# Patient Record
Sex: Female | Born: 1990 | Race: Black or African American | Hispanic: No | Marital: Single | Smoking: Former smoker
Health system: Southern US, Community
[De-identification: ages and names within clinical notes are randomized; demographics above are authoritative.]

## PROBLEM LIST (undated history)

## (undated) ENCOUNTER — Ambulatory Visit: Discharge status: Absent without leave | Payer: Self-pay

## (undated) ENCOUNTER — Inpatient Hospital Stay (HOSPITAL_COMMUNITY): Payer: Self-pay

## (undated) DIAGNOSIS — E669 Obesity, unspecified: Secondary | ICD-10-CM

## (undated) DIAGNOSIS — D649 Anemia, unspecified: Secondary | ICD-10-CM

## (undated) HISTORY — PX: WISDOM TOOTH EXTRACTION: SHX21

## (undated) HISTORY — DX: Obesity, unspecified: E66.9

## (undated) HISTORY — DX: Anemia, unspecified: D64.9

## (undated) HISTORY — PX: CHOLECYSTECTOMY: SHX55

## (undated) HISTORY — PX: ELBOW FRACTURE SURGERY: SHX616

---
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
## 2011-02-09 IMAGING — US US ABDOMEN COMPLETE
1 series · 13 of 25 positions shown · non-contrast
Comparison: None

CLINICAL DATA: Abdominal pain.  Question cholelithiasis.

COMPLETE ABDOMINAL ULTRASOUND

[Series 1: us abdomen complete · 0.32mm/px · 13 of 45 slices shown]
[im 1/45]
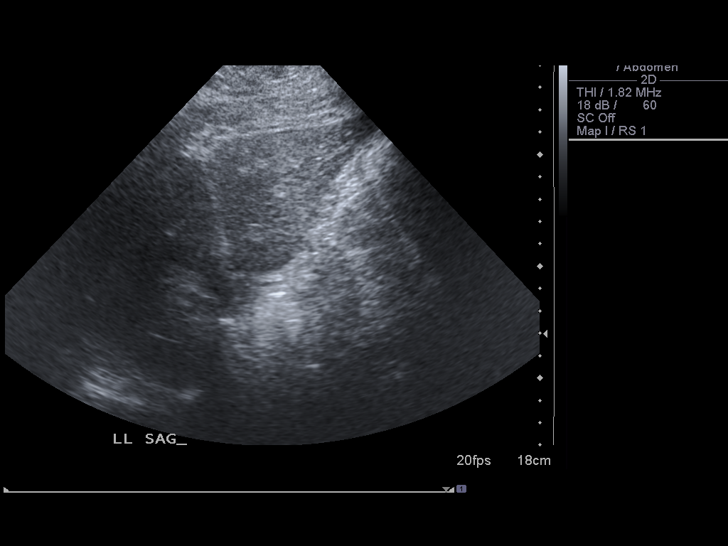
[im 4/45]
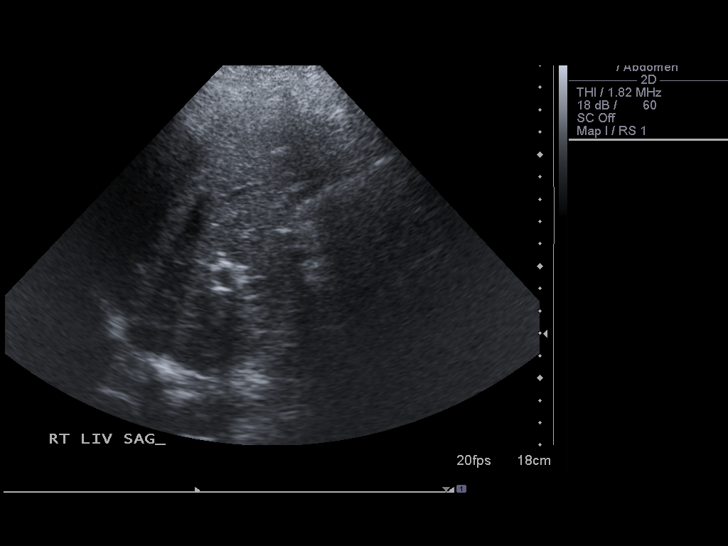
[im 8/45]
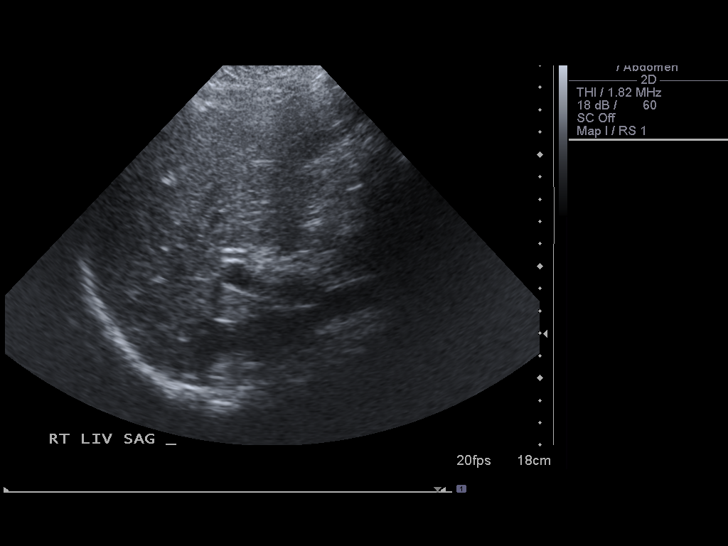
[im 12/45]
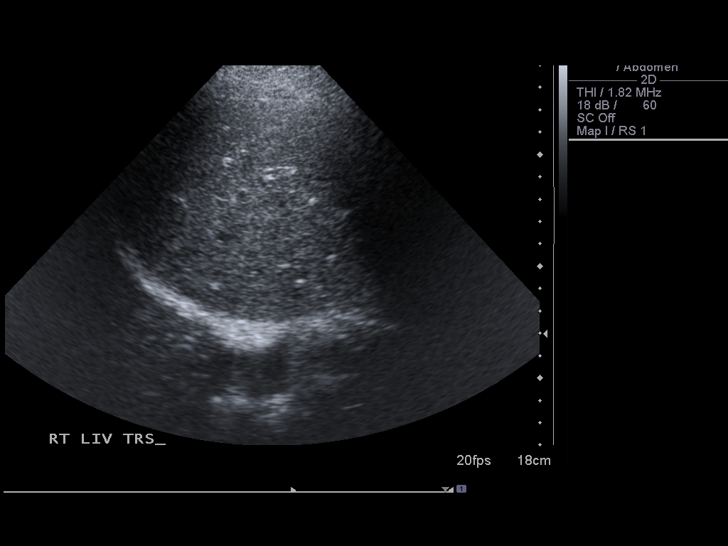
[im 15/45]
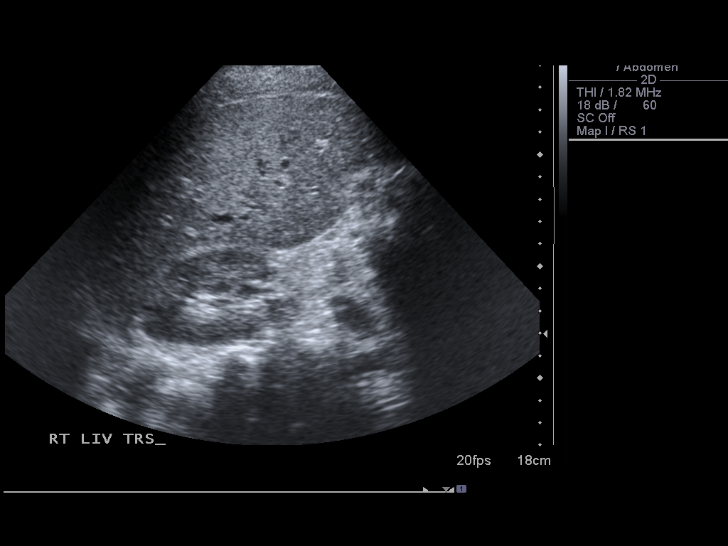
[im 19/45]
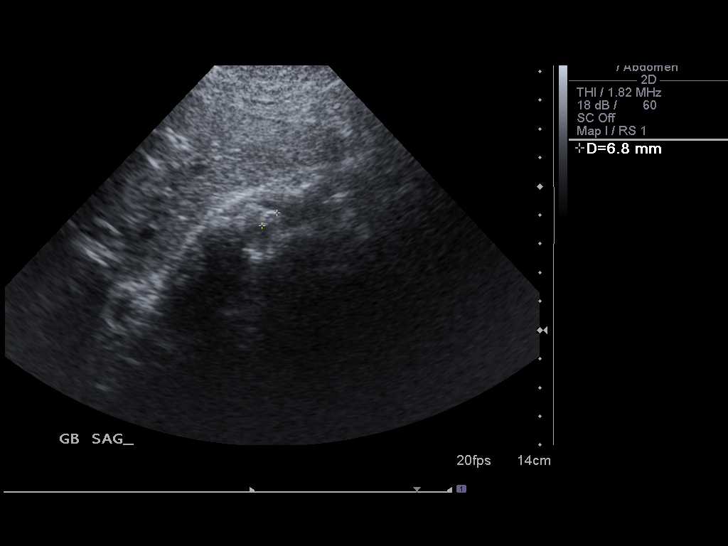
[im 23/45]
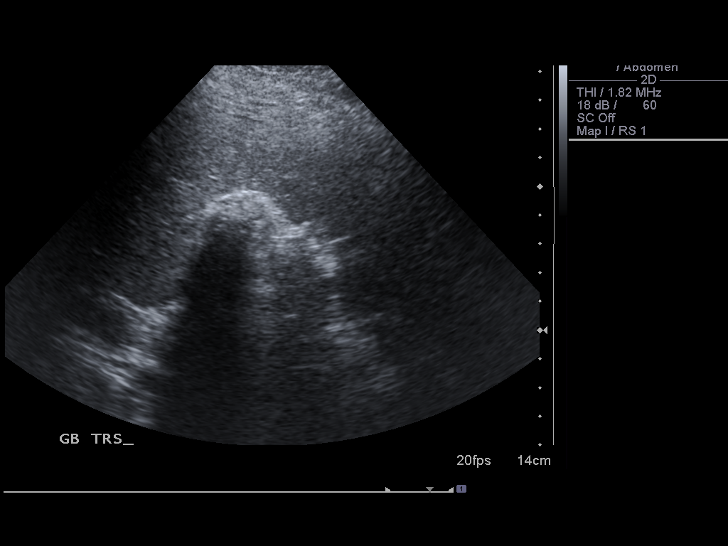
[im 26/45]
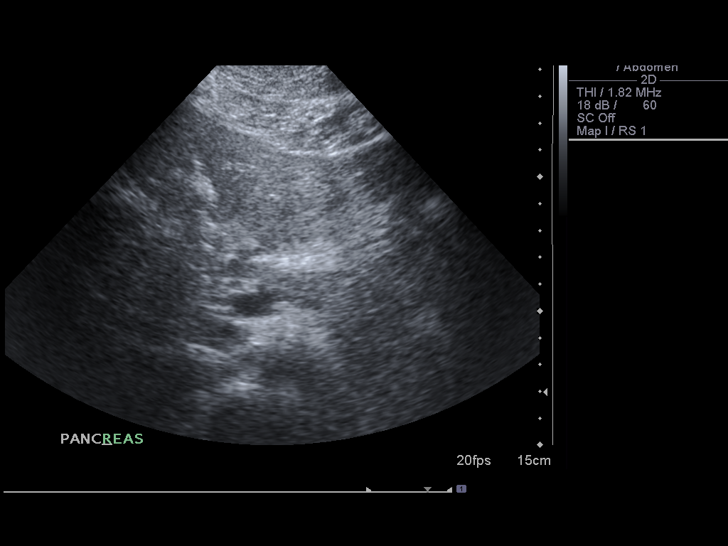
[im 30/45]
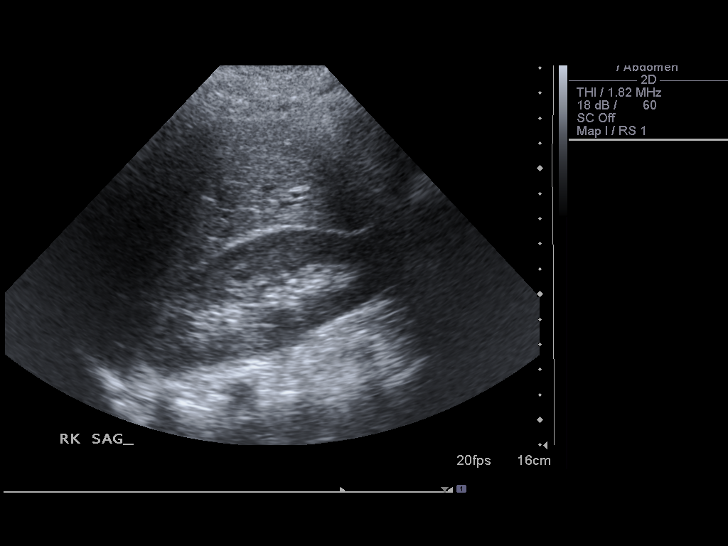
[im 34/45]
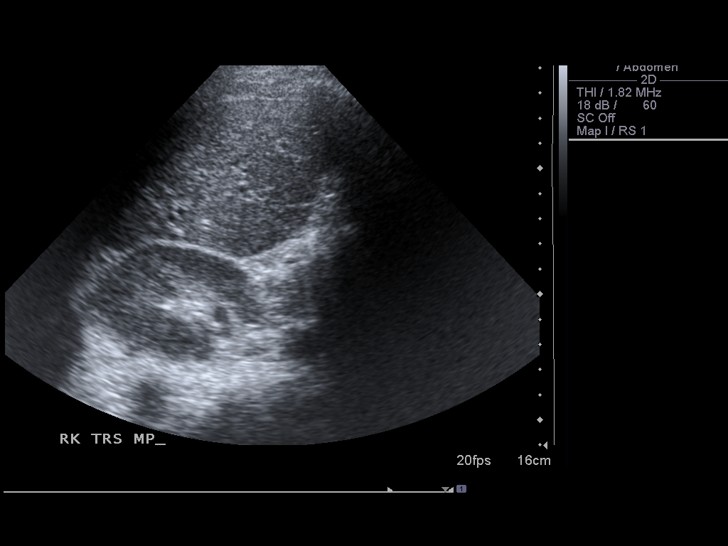
[im 37/45]
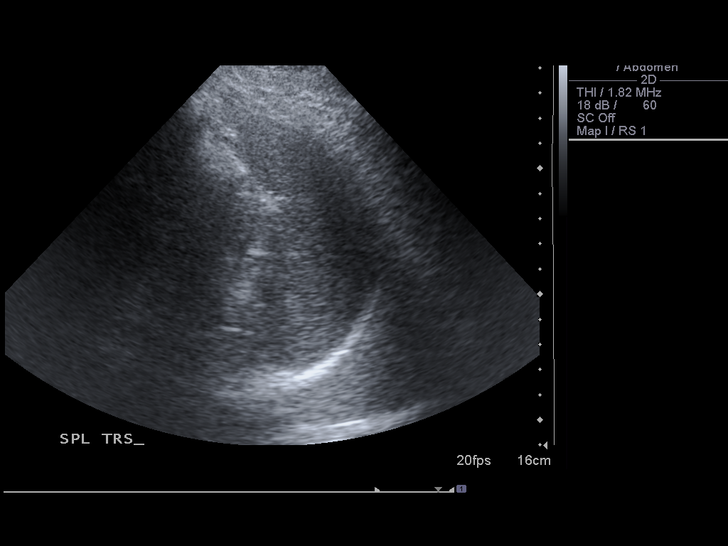
[im 41/45]
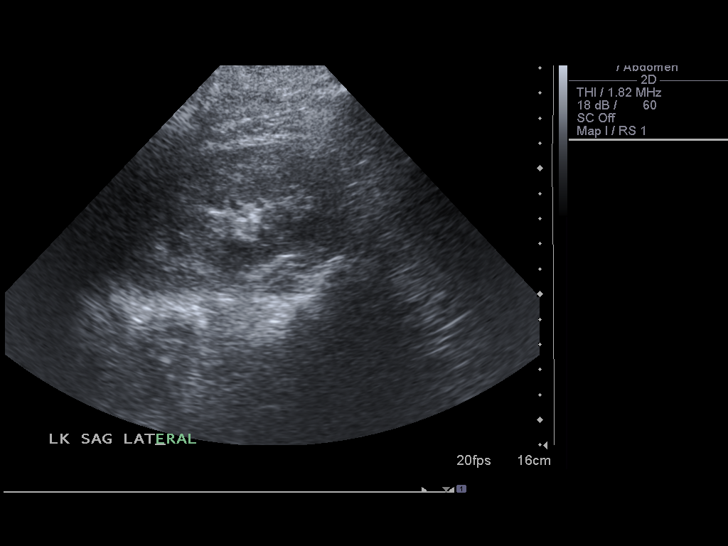
[im 45/45]
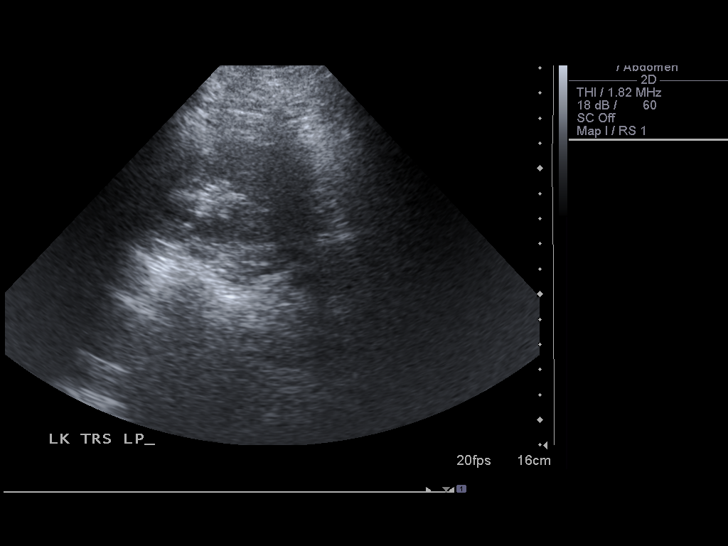

[13 of 25 positions shown; findings below may reference images not displayed]

FINDINGS: Gallbladder: The gallbladder is poorly contracted around multiple
gallstones (wall echo shadow sign).  There is no significant
gallbladder wall thickening.  Sonographic Murphy's sign is
negative.

Common bile duct:   Normal in caliber without filling defects.

Liver:  Echogenicity is within normal limits.  No focal hepatic
abnormalities are identified.

IVC:  Visualized portions appear unremarkable.

Pancreas:  Visualized portions appear unremarkable.Portions of the
pancreas are obscured by bowel gas.

Spleen:  Visualized portions appear unremarkable.

Right Kidney:   The renal cortical thickness and echogenicity are
preserved.  There is no hydronephrosis or focal abnormality. Renal
length is 10.7 cm.

Left Kidney:   The renal cortical thickness and echogenicity are
preserved.  There is no hydronephrosis or focal abnormality. Renal
length is 11.3 cm.

Abdominal aorta:  Visualized portions appear unremarkable.
IMPRESSION: 1.  Multiple gallstones within a contracted gallbladder.  No
sonographic evidence of cholecystitis or biliary dilatation.
2.  Otherwise unremarkable study.  Pancreatic visualization is
suboptimal.

## 2011-12-08 IMAGING — CR DG ELBOW 2V*R*
2 series · 2 of 2 positions shown · non-contrast
Comparison: None.

CLINICAL DATA: Fall.  Elbow injury and pain.

RIGHT ELBOW - 2 VIEW

[view not recorded (1 of 2)]
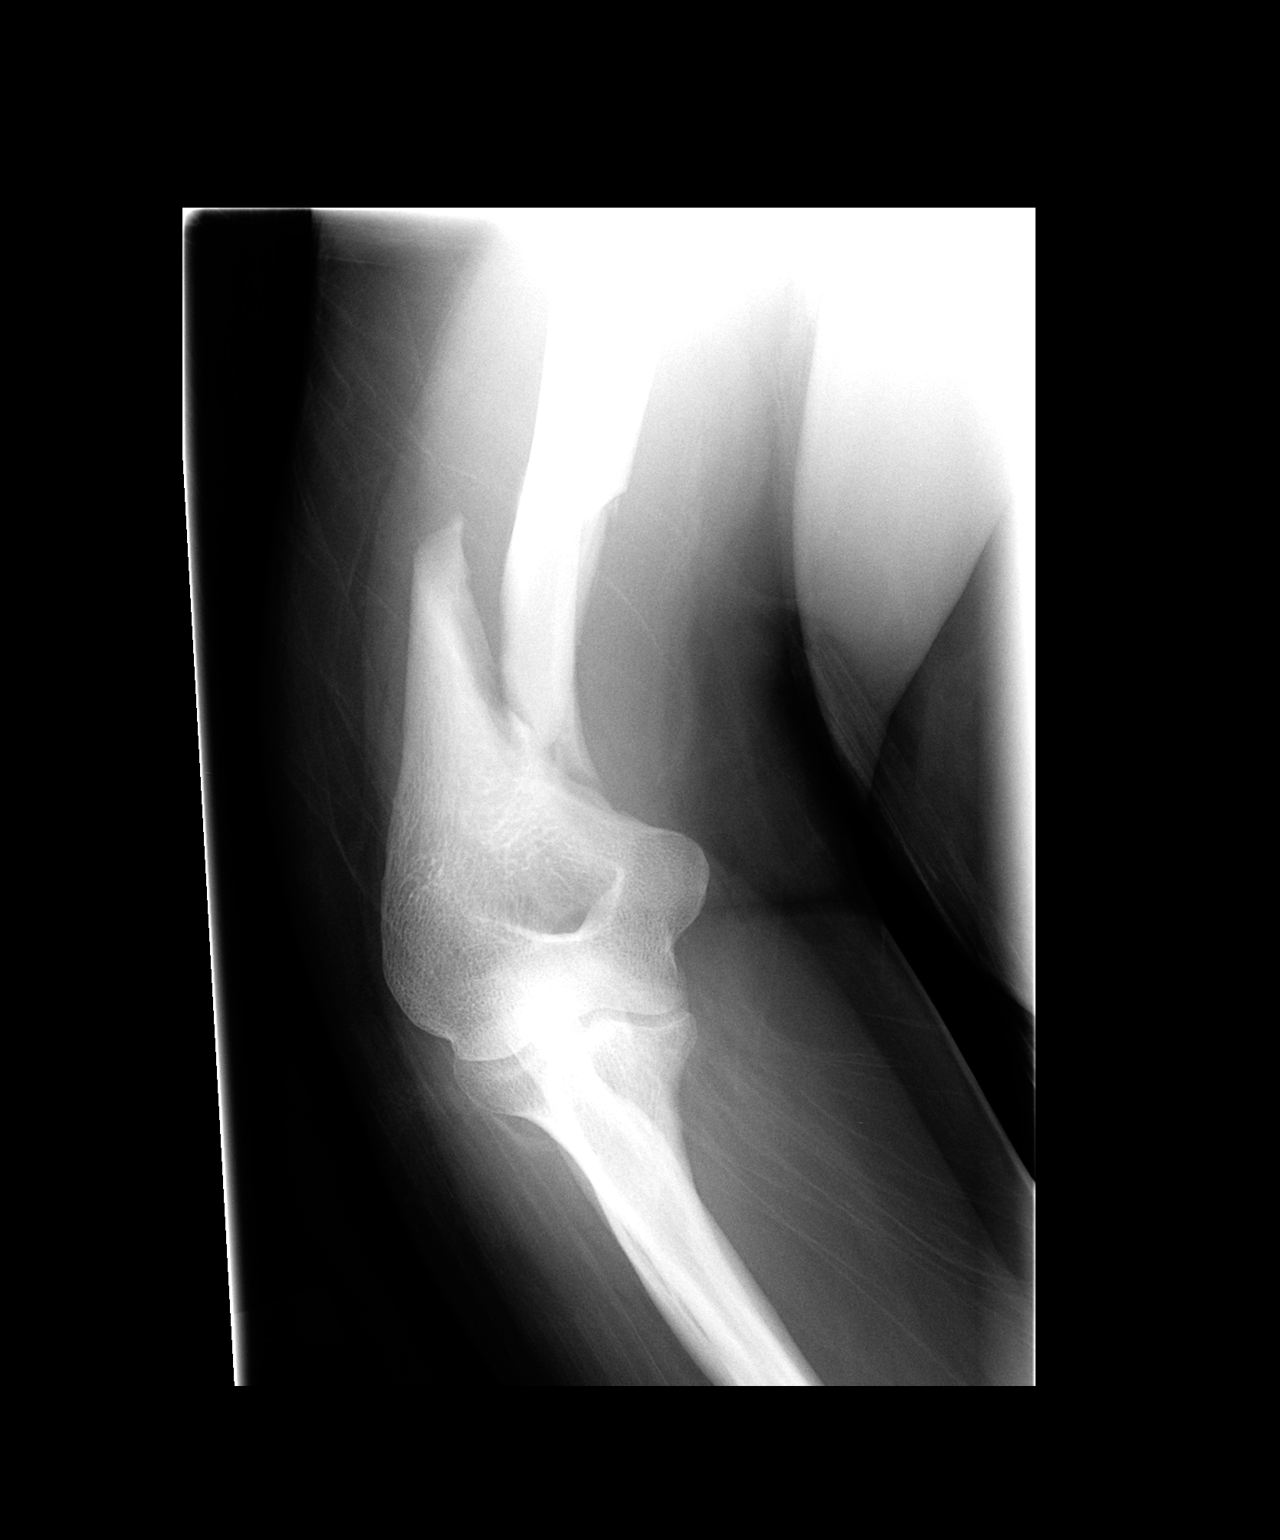

[view not recorded (2 of 2)]
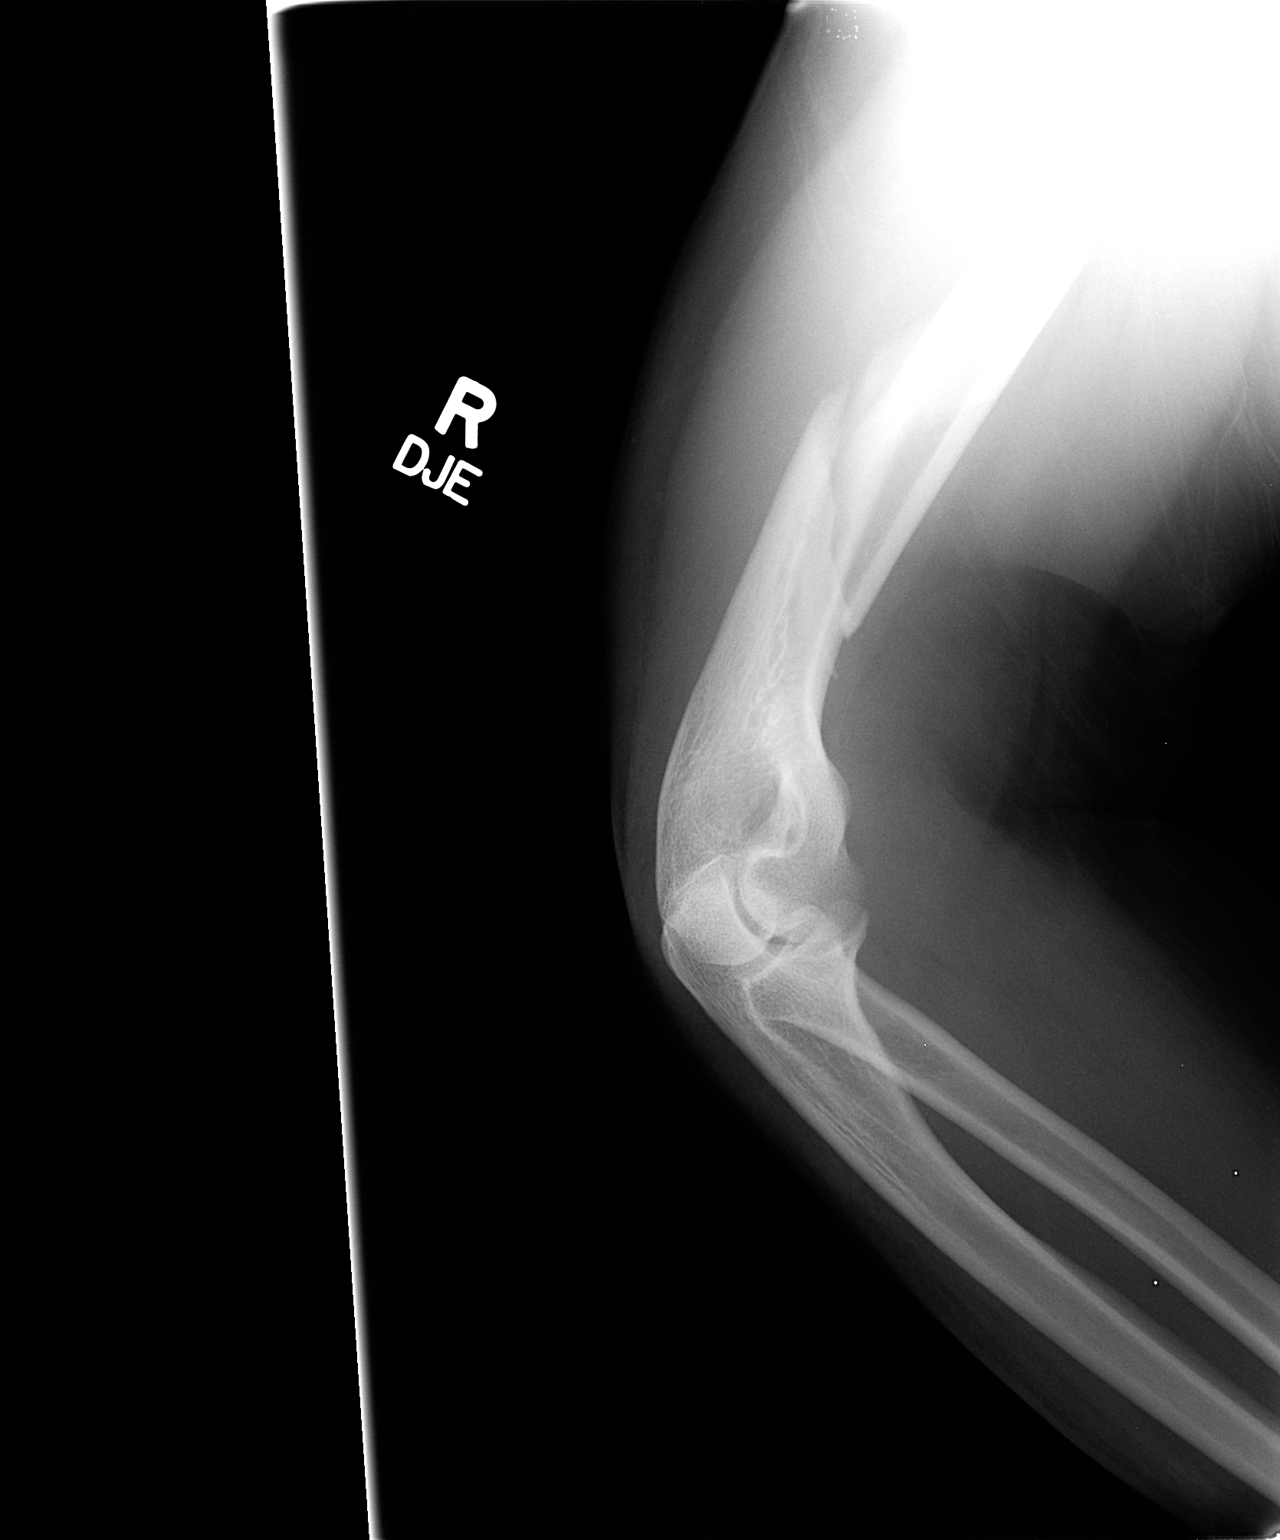

[2 of 2 positions shown; findings below may reference images not displayed]

FINDINGS: An oblique fracture of the distal humeral diaphysis is
seen with posterior displacement of the distal fracture fragment.
No fracture is seen involving the elbow joint.
IMPRESSION: Distal humeral diaphyseal fracture.

## 2012-03-27 ENCOUNTER — Encounter: Payer: Self-pay | Admitting: Obstetrics and Gynecology

## 2012-03-27 ENCOUNTER — Ambulatory Visit (INDEPENDENT_AMBULATORY_CARE_PROVIDER_SITE_OTHER): Payer: BC Managed Care – PPO | Admitting: Obstetrics and Gynecology

## 2012-03-27 VITALS — BP 122/78 | HR 70 | Ht 64.0 in | Wt 226.0 lb

## 2012-03-27 DIAGNOSIS — Z01419 Encounter for gynecological examination (general) (routine) without abnormal findings: Secondary | ICD-10-CM

## 2012-03-27 DIAGNOSIS — Z113 Encounter for screening for infections with a predominantly sexual mode of transmission: Secondary | ICD-10-CM

## 2012-03-27 NOTE — Progress Notes (Signed)
Regular Periods: yes Mammogram: no  Monthly Breast Ex.: no Exercise: yes  Tetanus < 10 years: yes Seatbelts: yes  NI. Bladder Functn.: yes Abuse at home: no  Daily BM's: yes Stressful Work: no  Healthy Diet: yes Sigmoid-Colonoscopy: NO  Calcium: no Medical problems this year: WANT BIRTH CONTROL   LAST PAP:2 YEARS AGO  Contraception: WITHDRAWAL   Mammogram:  NO  PCP: DR. Renae Gloss  PMH: NO CHANGE  [facility]: NO CHANGE  Last Bone Scan: NO  PT IS SINGLE (IN RELATIONSHIP)

## 2012-03-27 NOTE — Progress Notes (Signed)
Subjective:    [name] is a 21 y.o. female, G0P0, who presents for an annual exam. The patient wants to review contraception options.  Given handout of methods of contraception. Answered her questions about Nexplanon, Depo Provera and Ortho Evra Patch.  Menstrual cycle:   LMP: Patient's last menstrual period was 03/02/2012. Flow x 7 days with pad change 3 times a day;  4/10 cramps relieved with Tylenol or Ibuprofen             Review of Systems Pertinent items are noted in HPI. Denies pelvic pain, urinary tract symptoms, vaginitis symptoms, irregular bleeding, menopausal symptoms, change in bowel habits or rectal bleeding   Objective:    BP 122/78  Pulse 70  Ht 5\' 4"  (1.626 m)  Wt 226 lb (102.513 kg)  BMI 38.79 kg/m2  LMP 03/02/2012   @Weigh @t :  Wt Readings from Last 1 Encounters:  03/27/12 226 lb (102.513 kg)   Body mass index is 38.79 kg/(m^2). General Appearance: Alert, no acute distress HEENT: Grossly normal Neck / Thyroid: Supple, no thyromegaly or cervical adenopathy Lungs: Clear to auscultation bilaterally Back: No CVA tenderness Breast Exam: No masses or nodes.No dimpling, nipple retraction or discharge. Cardiovascular: Regular rate and rhythm.  Gastrointestinal: Soft, non-tender, no masses or organomegaly Pelvic Exam: EGBUS-wnl, vagina-normal rugae, cervix- without lesions or tenderness, uterus appears normal size shape and consistency, adnexae-no masses or tenderness Rectovaginal: no masses and normal sphincter tone Lymphatic Exam: Non-palpable nodes in neck, clavicular,  axillary, or inguinal regions  Skin: no rashes or abnormalities Extremities: no clubbing cyanosis or edema  Neurologic: grossly normal Psychiatric: Alert and oriented  Wet Prep: Urinalysis: UPT:    Assessment:   Routine GYN Exam   Plan:  Given handout of methods of contraception   GC/CT pending.  Nexplanon brochure and insertion appointment  RTO 1 year or  prn  Yuridia Couts,ELMIRAPA-C

## 2012-03-28 LAB — GC/CHLAMYDIA PROBE AMP, GENITAL
Chlamydia, DNA Probe: POSITIVE — AB
GC Probe Amp, Genital: NEGATIVE

## 2012-03-29 ENCOUNTER — Telehealth: Payer: Self-pay

## 2012-03-29 MED ORDER — AZITHROMYCIN 500 MG PO TABS: 1000.0000 mg | ORAL_TABLET | Freq: Once | ORAL | Status: AC

## 2012-03-29 NOTE — Telephone Encounter (Signed)
Message copied by Winfred Leeds on Wed Mar 29, 2012 11:08 AM ------      Message from: Henreitta Leber      Created: Tue Mar 28, 2012  9:35 PM       Patient with positive chlamydia needs to be treated with Zithromax 500 mg  #2  2 po stat and review STD protocol.  Thanks,  EP

## 2012-03-29 NOTE — Telephone Encounter (Signed)
LM ON HOME PHONE FOR PT TO CALL BACK. PT CELL IS NOT SET UP FOR MESSAGES.

## 2012-03-29 NOTE — Telephone Encounter (Signed)
TC TO PT REGARDING POSITIVE CT TEST. WENT OVER STD PROTOCOL AND SCHEDULED PT FOR 3 WEEK F/U TOC.WILL SEND PT A BROCHURE AND PT VOICED UNDERSTANDING.

## 2012-04-04 ENCOUNTER — Telehealth: Payer: Self-pay | Admitting: Obstetrics and Gynecology

## 2012-04-04 NOTE — Telephone Encounter (Signed)
Triage/TST RES QUEST.

## 2012-04-04 NOTE — Telephone Encounter (Signed)
Pt with several questions regarding positive CT infection. Questions answered. Pt voices understanding.

## 2012-04-19 ENCOUNTER — Encounter: Payer: BC Managed Care – PPO | Admitting: Obstetrics and Gynecology

## 2012-05-07 ENCOUNTER — Emergency Department (HOSPITAL_COMMUNITY): Payer: No Typology Code available for payment source

## 2012-05-07 ENCOUNTER — Encounter (HOSPITAL_COMMUNITY): Payer: Self-pay | Admitting: *Deleted

## 2012-05-07 ENCOUNTER — Emergency Department (HOSPITAL_COMMUNITY)
Admission: EM | Admit: 2012-05-07 | Discharge: 2012-05-07 | Disposition: A | Discharge status: Home / Self Care | Payer: No Typology Code available for payment source | Attending: Emergency Medicine | Admitting: Emergency Medicine

## 2012-05-07 DIAGNOSIS — S339XXA Sprain of unspecified parts of lumbar spine and pelvis, initial encounter: Secondary | ICD-10-CM | POA: Insufficient documentation

## 2012-05-07 DIAGNOSIS — Y92410 Unspecified street and highway as the place of occurrence of the external cause: Secondary | ICD-10-CM | POA: Insufficient documentation

## 2012-05-07 DIAGNOSIS — S39012A Strain of muscle, fascia and tendon of lower back, initial encounter: Secondary | ICD-10-CM

## 2012-05-07 DIAGNOSIS — T148XXA Other injury of unspecified body region, initial encounter: Secondary | ICD-10-CM

## 2012-05-07 DIAGNOSIS — Z9089 Acquired absence of other organs: Secondary | ICD-10-CM | POA: Insufficient documentation

## 2012-05-07 MED ORDER — CYCLOBENZAPRINE HCL 10 MG PO TABS: 10.0000 mg | ORAL_TABLET | Freq: Two times a day (BID) | ORAL | Status: DC | PRN

## 2012-05-07 MED ORDER — IBUPROFEN 600 MG PO TABS: 600.0000 mg | ORAL_TABLET | Freq: Four times a day (QID) | ORAL | Status: DC | PRN

## 2012-05-07 NOTE — Discharge Instructions (Signed)
 The x-ray of the arm reveals, your surgical hardware in place, and intact.  No change in alignment.  No new fractures
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
## 2012-05-08 NOTE — ED Provider Notes (Signed)
Medical screening examination/treatment/procedure(s) were performed by non-physician practitioner and as supervising physician I was immediately available for consultation/collaboration.  Zhana Jeangilles R. Aqeel Norgaard, MD 05/08/12 0013 

## 2012-07-19 NOTE — L&D Delivery Note (Signed)
Delivery Note At 1:55 AM a viable female was delivered via Vaginal, Spontaneous Delivery (Presentation: Left Occiput Anterior).  Cord presentation noted right after delivery of head, loose nuchal cord noted as well and easily slipped over the head.  APGAR: 4, 9; weight 7 lb 4.4 oz (3300 g). Placenta status: Intact, Expressed, Manual removal by Dr. Stefano Gaul after cord avulsion.  Placenta sent to Pathology.  Cord: 3 vessels with the following complications: none.  Cord pH: collected   Delivery of the head: 06/01/2013  1:53 AM First maneuver: 06/01/2013  1:54 AM, McRoberts Second maneuver: 06/01/2013  1:54 AM, Suprapubic Pressure    Anesthesia: Epidural  Episiotomy: None Lacerations: 1st degree;Vaginal Suture Repair: 3.0 vicryl Est. Blood Loss (mL): 350  Mom to postpartum.  Baby to Couplet care / Skin to Skin.  Routine [facility] PP orders Breastfeeding Inpatient circ  Haroldine Laws 06/01/2013, 3:14 AM

## 2012-08-17 ENCOUNTER — Encounter (HOSPITAL_COMMUNITY): Payer: Self-pay | Admitting: Obstetrics and Gynecology

## 2012-08-17 ENCOUNTER — Inpatient Hospital Stay (HOSPITAL_COMMUNITY)
Admission: AD | Admit: 2012-08-17 | Discharge: 2012-08-17 | Disposition: A | Discharge status: Home / Self Care | Payer: BC Managed Care – PPO | Source: Ambulatory Visit | Attending: Obstetrics & Gynecology | Admitting: Obstetrics & Gynecology

## 2012-08-17 DIAGNOSIS — R109 Unspecified abdominal pain: Secondary | ICD-10-CM | POA: Insufficient documentation

## 2012-08-17 DIAGNOSIS — R112 Nausea with vomiting, unspecified: Secondary | ICD-10-CM | POA: Insufficient documentation

## 2012-08-17 DIAGNOSIS — Z3202 Encounter for pregnancy test, result negative: Secondary | ICD-10-CM | POA: Insufficient documentation

## 2012-08-17 LAB — URINE MICROSCOPIC-ADD ON

## 2012-08-17 LAB — POCT PREGNANCY, URINE: Preg Test, Ur: NEGATIVE

## 2012-08-17 LAB — URINALYSIS, ROUTINE W REFLEX MICROSCOPIC
Bilirubin Urine: NEGATIVE
Glucose, UA: NEGATIVE mg/dL
Ketones, ur: NEGATIVE mg/dL
Leukocytes, UA: NEGATIVE
Nitrite: NEGATIVE
Protein, ur: NEGATIVE mg/dL
Specific Gravity, Urine: 1.025 (ref 1.005–1.030)
Urobilinogen, UA: 0.2 mg/dL (ref 0.0–1.0)
pH: 6.5 (ref 5.0–8.0)

## 2012-08-17 LAB — WET PREP, GENITAL
Trich, Wet Prep: NONE SEEN
Yeast Wet Prep HPF POC: NONE SEEN

## 2012-08-17 NOTE — MAU Note (Signed)
Pt states took upt at home which was negative, had menstrual cycle this past week that lasted 2 days, was abnormal, very light. Last normal cycle was 07/02/2012

## 2012-08-17 NOTE — MAU Note (Signed)
"  I started having a pain that just doesn't feel right about a week ago.  My period this month was 07/27/12 lasting 2 days and I normally have a 7 day cycle.  N/V that started the beginning of this week."

## 2012-08-17 NOTE — MAU Provider Note (Signed)
History     CSN: [id number]  Arrival date and time: 08/17/12 1700   None     Chief Complaint  Patient presents with  . Abdominal Pain   HPI  Pt is not pregnant and complains of bilateral lower abdominal pain for 3 weeks.  Pt states that her LNMP was 07/02/2012 and pt bled on 1/9 for 2 days.  Pt says she is nauseated and her breasts are tender.  Her friends and family tell her her face is puffy and pt states she has gained weight.  Pt denies abnormal vaginal discharge, UTI symptoms, constipation or diarrhea.  Pt is not using anything for contraception.  Past Medical History  Diagnosis Date  . Anemia     Past Surgical History  Procedure Date  . Cholecystectomy   . Elbow fracture surgery     BROKEN HUMEROUS  . Wisdom tooth extraction     Family History  Problem Relation Age of Onset  . Heart disease Paternal Grandmother   . Heart disease Maternal Grandmother   . Diabetes Maternal Grandmother   . Arthritis Father   . Hypertension Father   . Anemia Father   . Arthritis Mother   . Hypertension Mother     History  Substance Use Topics  . Smoking status: Current Every Day Smoker -- 0.1 packs/day for 2 years    Types: Cigarettes  . Smokeless tobacco: Not on file     Comment: 2-3 CIGARETTES A DAY  . Alcohol Use: No     Comment: OCCASIONAL    Allergies:  Allergies  Allergen Reactions  . Nickel   . Other     PT IS ALLERGIC TO GRASS    Prescriptions prior to admission  Medication Sig Dispense Refill  . Ascorbic Acid (VITAMIN C) 100 MG tablet Take 100 mg by mouth daily.      . cyclobenzaprine (FLEXERIL) 10 MG tablet Take 1 tablet (10 mg total) by mouth 2 (two) times daily as needed for muscle spasms.  20 tablet  0  . ibuprofen (ADVIL,MOTRIN) 600 MG tablet Take 1 tablet (600 mg total) by mouth every 6 (six) hours as needed for pain.  30 tablet  0    Review of Systems  Constitutional: Negative for fever and chills.  Gastrointestinal: Positive for nausea, vomiting  and abdominal pain. Negative for diarrhea and constipation.  Genitourinary: Negative for dysuria and urgency.   Physical Exam   Last menstrual period 07/02/2012.  Physical Exam  Nursing note and vitals reviewed. Constitutional: She is oriented to person, place, and time. She appears well-developed and well-nourished. No distress.  HENT:  Head: Normocephalic.  Eyes: Conjunctivae normal are normal. Pupils are equal, round, and reactive to light.  Neck: Normal range of motion. Neck supple.  Cardiovascular: Normal rate.   Respiratory: Effort normal.  GI: Soft. She exhibits no distension. There is no tenderness. There is no rebound and no guarding.  Genitourinary: Vagina normal and uterus normal. No vaginal discharge found.       nontender  Musculoskeletal: Normal range of motion.  Neurological: She is alert and oriented to person, place, and time.  Skin: Skin is warm and dry.  Psychiatric: She has a normal mood and affect.    MAU Course  Procedures Results for orders placed during the hospital encounter of 08/17/12 (from the past 24 hour(s))  URINALYSIS, ROUTINE W REFLEX MICROSCOPIC     Status: Abnormal   Collection Time   08/17/12  5:42 PM  Component Value Range   Color, Urine YELLOW  YELLOW   APPearance CLEAR  CLEAR   Specific Gravity, Urine 1.025  1.005 - 1.030   pH 6.5  5.0 - 8.0   Glucose, UA NEGATIVE  NEGATIVE mg/dL   Hgb urine dipstick TRACE (*) NEGATIVE   Bilirubin Urine NEGATIVE  NEGATIVE   Ketones, ur NEGATIVE  NEGATIVE mg/dL   Protein, ur NEGATIVE  NEGATIVE mg/dL   Urobilinogen, UA 0.2  0.0 - 1.0 mg/dL   Nitrite NEGATIVE  NEGATIVE   Leukocytes, UA NEGATIVE  NEGATIVE  URINE MICROSCOPIC-ADD ON     Status: Abnormal   Collection Time   08/17/12  5:42 PM      Component Value Range   Squamous Epithelial / LPF MANY (*) RARE   WBC, UA 0-2  <3 WBC/hpf   RBC / HPF 0-2  <3 RBC/hpf   Bacteria, UA FEW (*) RARE   Urine-Other AMORPHOUS URATES/PHOSPHATES    POCT  PREGNANCY, URINE     Status: Normal   Collection Time   08/17/12  6:36 PM      Component Value Range   Preg Test, Ur NEGATIVE  NEGATIVE  WET PREP, GENITAL     Status: Abnormal   Collection Time   08/17/12  8:18 PM      Component Value Range   Yeast Wet Prep HPF POC NONE SEEN  NONE SEEN   Trich, Wet Prep NONE SEEN  NONE SEEN   Clue Cells Wet Prep HPF POC MANY (*) NONE SEEN   WBC, Wet Prep HPF POC MODERATE (*) NONE SEEN   Discussed with pt that her UPT was negative and if she was having symptoms from her pregnancy, her hormone level would be elevated enough to have a positive UPT. Offered to have serum pregnancy test but pt declined Pt elected to leave before results of wet prep were available Advised pt to f/u with her OB/GYN provider where she has established care ( do not know name) Assessment and Plan  Abdominal pain- f/u with established provider  [facility] 08/17/2012, 8:05 PM

## 2012-08-17 NOTE — MAU Note (Signed)
Pt states here for bilateral lower abd pain, white vaginal non-odorous discharge. Pain for past few days, intermittently.

## 2012-08-18 LAB — GC/CHLAMYDIA PROBE AMP
CT Probe RNA: NEGATIVE
GC Probe RNA: NEGATIVE

## 2012-10-05 ENCOUNTER — Other Ambulatory Visit: Payer: Self-pay | Admitting: Certified Nurse Midwife

## 2012-10-06 LAB — PRENATAL PANEL VII
Antibody Screen: NEGATIVE
Basophils Absolute: 0 K/uL (ref 0.0–0.1)
Basophils Relative: 0 % (ref 0–1)
Eosinophils Absolute: 0.1 10*3/uL (ref 0.0–0.7)
Eosinophils Relative: 1 % (ref 0–5)
HCT: 37.5 % (ref 36.0–46.0)
HIV: NONREACTIVE
Hemoglobin: 12.3 g/dL (ref 12.0–15.0)
Hepatitis B Surface Ag: NEGATIVE
Lymphocytes Relative: 31 % (ref 12–46)
Lymphs Abs: 2 K/uL (ref 0.7–4.0)
MCH: 24.5 pg — ABNORMAL LOW (ref 26.0–34.0)
MCHC: 32.8 g/dL (ref 30.0–36.0)
MCV: 74.7 fL — ABNORMAL LOW (ref 78.0–100.0)
Monocytes Absolute: 0.6 10*3/uL (ref 0.1–1.0)
Monocytes Relative: 10 % (ref 3–12)
Neutro Abs: 3.7 K/uL (ref 1.7–7.7)
Neutrophils Relative %: 58 % (ref 43–77)
Platelets: 384 K/uL (ref 150–400)
RBC: 5.02 MIL/uL (ref 3.87–5.11)
RDW: 14.4 % (ref 11.5–15.5)
Rh Type: POSITIVE
Rubella: 5.45 Index — ABNORMAL HIGH (ref <0.90)
WBC: 6.3 10*3/uL (ref 4.0–10.5)

## 2012-10-06 LAB — GC/CHLAMYDIA PROBE AMP, URINE
Chlamydia, Swab/Urine, PCR: NEGATIVE
GC Probe Amp, Urine: NEGATIVE

## 2012-10-06 LAB — HCG, QUANTITATIVE, PREGNANCY: hCG, Beta Chain, Quant, S: 4644.1 m[IU]/mL

## 2012-10-07 LAB — CULTURE, OB URINE: Colony Count: 35000

## 2012-10-09 LAB — HEMOGLOBINOPATHY EVALUATION
Hemoglobin Other: 0 %
Hgb A2 Quant: 2.6 % (ref 2.2–3.2)
Hgb A: 97.4 % (ref 96.8–97.8)
Hgb F Quant: 0 % (ref 0.0–2.0)
Hgb S Quant: 0 %

## 2012-11-06 DIAGNOSIS — O26849 Uterine size-date discrepancy, unspecified trimester: Secondary | ICD-10-CM | POA: Insufficient documentation

## 2013-02-08 ENCOUNTER — Encounter (HOSPITAL_COMMUNITY): Payer: Self-pay | Admitting: *Deleted

## 2013-02-08 ENCOUNTER — Inpatient Hospital Stay (HOSPITAL_COMMUNITY)
Admission: AD | Admit: 2013-02-08 | Discharge: 2013-02-08 | Disposition: A | Discharge status: Home / Self Care | Payer: BC Managed Care – PPO | Source: Ambulatory Visit | Attending: Obstetrics and Gynecology | Admitting: Obstetrics and Gynecology

## 2013-02-08 DIAGNOSIS — R51 Headache: Secondary | ICD-10-CM | POA: Insufficient documentation

## 2013-02-08 DIAGNOSIS — N390 Urinary tract infection, site not specified: Secondary | ICD-10-CM | POA: Diagnosis not present

## 2013-02-08 DIAGNOSIS — Z6841 Body Mass Index (BMI) 40.0 and over, adult: Secondary | ICD-10-CM | POA: Insufficient documentation

## 2013-02-08 DIAGNOSIS — M549 Dorsalgia, unspecified: Secondary | ICD-10-CM

## 2013-02-08 DIAGNOSIS — Z91040 Latex allergy status: Secondary | ICD-10-CM | POA: Diagnosis not present

## 2013-02-08 DIAGNOSIS — IMO0002 Reserved for concepts with insufficient information to code with codable children: Secondary | ICD-10-CM | POA: Insufficient documentation

## 2013-02-08 DIAGNOSIS — R109 Unspecified abdominal pain: Secondary | ICD-10-CM | POA: Insufficient documentation

## 2013-02-08 DIAGNOSIS — E669 Obesity, unspecified: Secondary | ICD-10-CM | POA: Diagnosis present

## 2013-02-08 LAB — PROTEIN / CREATININE RATIO, URINE
Creatinine, Urine: 82.79 mg/dL
Protein Creatinine Ratio: 0.08 (ref 0.00–0.15)
Total Protein, Urine: 6.4 mg/dL

## 2013-02-08 LAB — COMPREHENSIVE METABOLIC PANEL
ALT: 11 U/L (ref 0–35)
AST: 12 U/L (ref 0–37)
Alkaline Phosphatase: 70 U/L (ref 39–117)
CO2: 20 mEq/L (ref 19–32)
Calcium: 9.1 mg/dL (ref 8.4–10.5)
GFR calc non Af Amer: >90 mL/min (ref >90)
Potassium: 3.6 mEq/L (ref 3.5–5.1)
Sodium: 134 mEq/L — ABNORMAL LOW (ref 135–145)
Total Protein: 6.3 g/dL (ref 6.0–8.3)

## 2013-02-08 LAB — CBC WITH DIFFERENTIAL/PLATELET
Basophils Absolute: 0 10*3/uL (ref 0.0–0.1)
Basophils Relative: 0 % (ref 0–1)
Eosinophils Absolute: 0 K/uL (ref 0.0–0.7)
Eosinophils Relative: 0 % (ref 0–5)
HCT: 32.1 % — ABNORMAL LOW (ref 36.0–46.0)
Hemoglobin: 10.5 g/dL — ABNORMAL LOW (ref 12.0–15.0)
Lymphocytes Relative: 18 % (ref 12–46)
Lymphs Abs: 1.7 K/uL (ref 0.7–4.0)
MCH: 25.2 pg — ABNORMAL LOW (ref 26.0–34.0)
MCHC: 32.7 g/dL (ref 30.0–36.0)
MCV: 77.2 fL — ABNORMAL LOW (ref 78.0–100.0)
Monocytes Absolute: 0.6 K/uL (ref 0.1–1.0)
Monocytes Relative: 7 % (ref 3–12)
Neutro Abs: 6.8 K/uL (ref 1.7–7.7)
Neutrophils Relative %: 74 % (ref 43–77)
Platelets: 307 10*3/uL (ref 150–400)
RBC: 4.16 MIL/uL (ref 3.87–5.11)
RDW: 13.7 % (ref 11.5–15.5)
WBC: 9.2 10*3/uL (ref 4.0–10.5)

## 2013-02-08 LAB — COMPREHENSIVE METABOLIC PANEL WITH GFR
Albumin: 2.8 g/dL — ABNORMAL LOW (ref 3.5–5.2)
BUN: 8 mg/dL (ref 6–23)
Chloride: 103 meq/L (ref 96–112)
Creatinine, Ser: 0.55 mg/dL (ref 0.50–1.10)
GFR calc Af Amer: >90 mL/min (ref >90)
Glucose, Bld: 109 mg/dL — ABNORMAL HIGH (ref 70–99)
Total Bilirubin: 0.2 mg/dL — ABNORMAL LOW (ref 0.3–1.2)

## 2013-02-08 LAB — URINALYSIS, ROUTINE W REFLEX MICROSCOPIC
Bilirubin Urine: NEGATIVE
Glucose, UA: NEGATIVE mg/dL
Ketones, ur: NEGATIVE mg/dL
Leukocytes, UA: NEGATIVE
Nitrite: NEGATIVE
Protein, ur: NEGATIVE mg/dL
Specific Gravity, Urine: 1.02 (ref 1.005–1.030)
Urobilinogen, UA: 0.2 mg/dL (ref 0.0–1.0)
pH: 7 (ref 5.0–8.0)

## 2013-02-08 LAB — LACTATE DEHYDROGENASE: LDH: 121 U/L (ref 94–250)

## 2013-02-08 LAB — URINE MICROSCOPIC-ADD ON

## 2013-02-08 LAB — URIC ACID: Uric Acid, Serum: 6.2 mg/dL (ref 2.4–7.0)

## 2013-02-08 MED ORDER — LACTATED RINGERS IV BOLUS (SEPSIS)
1000.0000 mL | Freq: Once | INTRAVENOUS | Status: AC
  Administered 2013-02-08: 1000 mL via INTRAVENOUS

## 2013-02-08 MED ORDER — ONDANSETRON HCL 4 MG/2ML IJ SOLN
4.0000 mg | Freq: Once | INTRAMUSCULAR | Status: AC
  Administered 2013-02-08: 4 mg via INTRAVENOUS
  Filled 2013-02-08: qty 2

## 2013-02-08 NOTE — MAU Note (Signed)
Patient states she has been having nausea, a headache and feeling weak since this am. Had been having cramping but not now.

## 2013-02-08 NOTE — MAU Note (Signed)
Hasn't felt good all wk, worse today, threw up once, went home from work then. Diarrhea yesterday, not today.

## 2013-02-08 NOTE — MAU Provider Note (Signed)
History   22 yo G2P0010 at 59 2/7 weeks presented unannounced c/o worsening fatigue this week, with mild HA, nausea, one episode vomiting today, diarrhea yesterday, none today, occasional cramping.  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  No known viral exposures.  Denies fever, dysuria, URI, visual sx, epigastric pain, d/c, bleeding, or leaking of fluid.  Patient Active Problem List   Diagnosis Date Noted  . Severe obesity (BMI >= 40) 02/08/2013  . 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 UTI (urinary tract infection) in pregnancy 02/08/2013  . Back pain complicating pregnancy 02/08/2013     Chief Complaint  Patient presents with  . Nausea  . Fatigue  . Headache     OB History   Grav Para Term Preterm Abortions TAB SAB Ect Mult Living   2    1 1     0      Past Medical History  Diagnosis Date  . Anemia   . Infection     uti    Past Surgical History  Procedure Laterality Date  . Cholecystectomy    . Elbow fracture surgery      BROKEN HUMEROUS  . Wisdom tooth extraction      Family History  Problem Relation Age of Onset  . Heart disease Paternal Grandmother   . Heart disease Maternal Grandmother   . Diabetes Maternal Grandmother   . Arthritis Father   . Hypertension Father   . Anemia Father   . Arthritis Mother   . Hypertension Mother     History  Substance Use Topics  . Smoking status: Former Smoker -- 0.10 packs/day for 2 years    Types: Cigarettes  . Smokeless tobacco: Never Used     Comment: quit after found out preg  . Alcohol Use: No     Comment: OCCASIONAL    Allergies:  Allergies  Allergen Reactions  . Nickel   . Other     PT IS ALLERGIC TO GRASS    Prescriptions prior to admission  Medication Sig Dispense Refill  . [DISCONTINUED] Prenatal Vit-Fe Fumarate-FA (PRENATAL MULTIVITAMIN) TABS Take 1 tablet by mouth daily at 12 noon.      . [DISCONTINUED] Ascorbic Acid (VITAMIN C) 100 MG tablet Take 100 mg by mouth  daily.      . [DISCONTINUED] ibuprofen (ADVIL,MOTRIN) 600 MG tablet Take 1 tablet (600 mg total) by mouth every 6 (six) hours as needed for pain.  30 tablet  0     Physical Exam   Blood pressure 121/71, pulse 85, temperature 98.3 F (36.8 C), temperature source Oral, resp. rate 18, height 5\' 4"  (1.626 m), weight 264 lb 12.8 oz (120.112 kg), last menstrual period 07/02/2012.  Filed Vitals:   02/08/13 1544 02/08/13 1550 02/08/13 1710  BP:  117/64 121/71  Pulse:  92 85  Temp:  98.3 F (36.8 C)   TempSrc:  Oral   Resp:  18 18  Height: 5\' 4"  (1.626 m)    Weight: 264 lb 12.8 oz (120.112 kg)     Initial BP was documented as 123/92--initial orders were based on that value.  Upon review, this was noted to be an incorrect transcription of the actual result.  Chest clear Heart RRR with murmur Abd gravid, NT Pelvic--closed and long, no d/c in vault. Ext WNL  FHR reassuring No UCs.  ED Course  IUP at 23 2/7 weeks Malaise/fatigue, ? Viral syndrome  Plan: CBC, diff, CMP, uric acid, LDH, vit D, TSH  IV LR x 1 bag Zofran Protein/creatnine ratio   Joshva Labreck CNM, [place] 02/08/2013 5:20 PM  Addendum: Feeling better after IV hydration and Zofran--denies any nausea now. Tolerating po fluids without any difficulty.  Results for orders placed during the hospital encounter of 02/08/13 (from the past 24 hour(s))  URINALYSIS, ROUTINE W REFLEX MICROSCOPIC     Status: Abnormal   Collection Time    02/08/13  3:45 PM      Result Value Range   Color, Urine YELLOW  YELLOW   APPearance CLEAR  CLEAR   Specific Gravity, Urine 1.020  1.005 - 1.030   pH 7.0  5.0 - 8.0   Glucose, UA NEGATIVE  NEGATIVE mg/dL   Hgb urine dipstick LARGE (*) NEGATIVE   Bilirubin Urine NEGATIVE  NEGATIVE   Ketones, ur NEGATIVE  NEGATIVE mg/dL   Protein, ur NEGATIVE  NEGATIVE mg/dL   Urobilinogen, UA 0.2  0.0 - 1.0 mg/dL   Nitrite NEGATIVE  NEGATIVE   Leukocytes, UA NEGATIVE  NEGATIVE  PROTEIN / CREATININE RATIO,  URINE     Status: None   Collection Time    02/08/13  3:45 PM      Result Value Range   Creatinine, Urine 82.79     Total Protein, Urine 6.4     PROTEIN CREATININE RATIO 0.08  0.00 - 0.15  URINE MICROSCOPIC-ADD ON     Status: None   Collection Time    02/08/13  3:45 PM      Result Value Range   Squamous Epithelial / LPF RARE  RARE   WBC, UA 0-2  <3 WBC/hpf   RBC / HPF 11-20  <3 RBC/hpf   Urine-Other MUCOUS PRESENT    CBC WITH DIFFERENTIAL     Status: Abnormal   Collection Time    02/08/13  4:00 PM      Result Value Range   WBC 9.2  4.0 - 10.5 K/uL   RBC 4.16  3.87 - 5.11 MIL/uL   Hemoglobin 10.5 (*) 12.0 - 15.0 g/dL   HCT 81.1 (*) 91.4 - 78.2 %   MCV 77.2 (*) 78.0 - 100.0 fL   MCH 25.2 (*) 26.0 - 34.0 pg   MCHC 32.7  30.0 - 36.0 g/dL   RDW 95.6  21.3 - 08.6 %   Platelets 307  150 - 400 K/uL   Neutrophils Relative % 74  43 - 77 %   Neutro Abs 6.8  1.7 - 7.7 K/uL   Lymphocytes Relative 18  12 - 46 %   Lymphs Abs 1.7  0.7 - 4.0 K/uL   Monocytes Relative 7  3 - 12 %   Monocytes Absolute 0.6  0.1 - 1.0 K/uL   Eosinophils Relative 0  0 - 5 %   Eosinophils Absolute 0.0  0.0 - 0.7 K/uL   Basophils Relative 0  0 - 1 %   Basophils Absolute 0.0  0.0 - 0.1 K/uL  COMPREHENSIVE METABOLIC PANEL     Status: Abnormal   Collection Time    02/08/13  4:00 PM      Result Value Range   Sodium 134 (*) 135 - 145 mEq/L   Potassium 3.6  3.5 - 5.1 mEq/L   Chloride 103  96 - 112 mEq/L   CO2 20  19 - 32 mEq/L   Glucose, Bld 109 (*) 70 - 99 mg/dL   BUN 8  6 - 23 mg/dL   Creatinine, Ser 5.78  0.50 - 1.10 mg/dL  Calcium 9.1  8.4 - 10.5 mg/dL   Total Protein 6.3  6.0 - 8.3 g/dL   Albumin 2.8 (*) 3.5 - 5.2 g/dL   AST 12  0 - 37 U/L   ALT 11  0 - 35 U/L   Alkaline Phosphatase 70  39 - 117 U/L   Total Bilirubin 0.2 (*) 0.3 - 1.2 mg/dL   GFR calc non Af Amer >90  >90 mL/min   GFR calc Af Amer >90  >90 mL/min  LACTATE DEHYDROGENASE     Status: None   Collection Time    02/08/13  4:00 PM       Result Value Range   LDH 121  94 - 250 U/L  URIC ACID     Status: None   Collection Time    02/08/13  4:00 PM      Result Value Range   Uric Acid, Serum 6.2  2.4 - 7.0 mg/dL    Impressioni: Viral syndrome Mild anemia Hematuria  Plan: D/C home--to increase fluids and rest over the next few days. OOW tomorrow--note given. Urine to culture Recommended iron supplementation with Floridix. Will f/u on TSH and Vitamin D. Patient reassured.  Nigel Bridgeman, CNM 02/08/13 5:50p

## 2013-02-08 NOTE — Discharge Instructions (Signed)
 Anemia, Nonspecific Your exam and blood tests show you are anemic. This means your blood (hemoglobin) level is low. Normal hemoglobin values are 12 to 15 g/dL for females (88+ during pregnancy) and 14 to 17 g/dL for males. Make a note of your hemoglobin level today. The hematocrit percent is also used to measure anemia. A normal hematocrit is 38% to 46% in females and 42% to 49% in males. Make a note of your hematocrit level today. CAUSES  Anemia can be due to many different causes.  Excessive bleeding from periods (in women).  Intestinal bleeding.  Poor nutrition.  Kidney, thyroid , liver, and bone marrow diseases. SYMPTOMS  Anemia can come on suddenly (acute). It can also come on slowly. Symptoms can include:  Minor weakness.  Dizziness.  Palpitations.  Shortness of breath. Symptoms may be absent until half your hemoglobin is missing if it comes on slowly. Anemia due to acute blood loss from an injury or internal bleeding may require blood transfusion if the loss is severe. Hospital care is needed if you are anemic and there is significant continual blood loss. TREATMENT   Stool tests for blood (Hemoccult) and additional lab tests are often needed. This determines the best treatment.  Further checking on your condition and your response to treatment is very important. It often takes many weeks to correct anemia. Depending on the cause, treatment can include:  Supplements of iron.  Vitamins B12 and folic acid .  Hormone medicines. If your anemia is due to bleeding, finding the cause of the blood loss is very important. This will help avoid further problems. SEEK IMMEDIATE MEDICAL CARE IF:   You develop fainting, extreme weakness, shortness of breath, or chest pain.  You develop heavy vaginal bleeding.  You develop bloody or black, tarry stools or vomit up blood.  You develop a high fever, rash, repeated vomiting, or dehydration. Document Released: 08/12/2004 Document  Revised: 09/27/2011 Document Reviewed: 05/20/2009 [facility] Patient Information 2014 [place], [place].  Fatigue Fatigue is a feeling of tiredness, lack of energy, lack of motivation, or feeling tired all the time. Having enough rest, good nutrition, and reducing stress will normally reduce fatigue. Consult your caregiver if it persists. The nature of your fatigue will help your caregiver to find out its cause. The treatment is based on the cause.  CAUSES  There are many causes for fatigue. Most of the time, fatigue can be traced to one or more of your habits or routines. Most causes fit into one or more of three general areas. They are: Lifestyle problems  Sleep disturbances.  Overwork.  Physical exertion.  Unhealthy habits.  Poor eating habits or eating disorders.  Alcohol and/or drug use .  Lack of proper nutrition (malnutrition). Psychological problems  Stress and/or anxiety problems.  Depression.  Grief.  Boredom. Medical Problems or Conditions  Anemia.  Pregnancy.  Thyroid  gland problems.  Recovery from major surgery.  Continuous pain.  Emphysema or asthma that is not well controlled  Allergic conditions.  Diabetes.  Infections (such as mononucleosis).  Obesity.  Sleep disorders, such as sleep apnea.  Heart failure or other heart-related problems.  Cancer.  Kidney disease.  Liver disease.  Effects of certain medicines such as antihistamines, cough and cold remedies, prescription pain medicines, heart and blood pressure medicines, drugs used for treatment of cancer, and some antidepressants. SYMPTOMS  The symptoms of fatigue include:   Lack of energy.  Lack of drive (motivation).  Drowsiness.  Feeling of indifference to the surroundings. DIAGNOSIS  The details of how you feel help guide your caregiver in finding out what is causing the fatigue. You will be asked about your present and past health condition. It is important to review all  medicines that you take, including prescription and non-prescription items. A thorough exam will be done. You will be questioned about your feelings, habits, and normal lifestyle. Your caregiver may suggest blood tests, urine tests, or other tests to look for common medical causes of fatigue.  TREATMENT  Fatigue is treated by correcting the underlying cause. For example, if you have continuous pain or depression, treating these causes will improve how you feel. Similarly, adjusting the dose of certain medicines will help in reducing fatigue.  HOME CARE INSTRUCTIONS   Try to get the required amount of good sleep every night.  Eat a healthy and nutritious diet, and drink enough water throughout the day.  Practice ways of relaxing (including yoga or meditation).  Exercise regularly.  Make plans to change situations that cause stress. Act on those plans so that stresses decrease over time. Keep your work and personal routine reasonable.  Avoid street drugs and minimize use of alcohol.  Start taking a daily multivitamin after consulting your caregiver. SEEK MEDICAL CARE IF:   You have persistent tiredness, which cannot be accounted for.  You have fever.  You have unintentional weight loss.  You have headaches.  You have disturbed sleep throughout the night.  You are feeling sad.  You have constipation.  You have dry skin.  You have gained weight.  You are taking any new or different medicines that you suspect are causing fatigue.  You are unable to sleep at night.  You develop any unusual swelling of your legs or other parts of your body. SEEK IMMEDIATE MEDICAL CARE IF:   You are feeling confused.  Your vision is blurred.  You feel faint or pass out.  You develop severe headache.  You develop severe abdominal, pelvic, or back pain.  You develop chest pain, shortness of breath, or an irregular or fast heartbeat.  You are unable to pass a normal amount of  urine.  You develop abnormal bleeding such as bleeding from the rectum or you vomit blood.  You have thoughts about harming yourself or committing suicide.  You are worried that you might harm someone else. MAKE SURE YOU:   Understand these instructions.  Will watch your condition.  Will get help right away if you are not doing well or get worse. Document Released: 05/02/2007 Document Revised: 09/27/2011 Document Reviewed: 05/02/2007 [facility] Patient Information 2014 [place], [place].

## 2013-02-09 LAB — URINE CULTURE: Colony Count: 40000

## 2013-02-09 LAB — TSH: TSH: 0.964 u[IU]/mL (ref 0.350–4.500)

## 2013-02-09 LAB — VITAMIN D 25 HYDROXY (VIT D DEFICIENCY, FRACTURES): Vit D, 25-Hydroxy: 19 ng/mL — ABNORMAL LOW (ref 30–89)

## 2013-03-22 ENCOUNTER — Encounter (HOSPITAL_COMMUNITY): Payer: Self-pay | Admitting: *Deleted

## 2013-03-22 ENCOUNTER — Inpatient Hospital Stay (HOSPITAL_COMMUNITY)
Admission: AD | Admit: 2013-03-22 | Discharge: 2013-03-22 | Disposition: A | Discharge status: Home / Self Care | Payer: BC Managed Care – PPO | Source: Ambulatory Visit | Attending: Obstetrics and Gynecology | Admitting: Obstetrics and Gynecology

## 2013-03-22 DIAGNOSIS — K649 Unspecified hemorrhoids: Secondary | ICD-10-CM | POA: Insufficient documentation

## 2013-03-22 DIAGNOSIS — R197 Diarrhea, unspecified: Secondary | ICD-10-CM | POA: Insufficient documentation

## 2013-03-22 DIAGNOSIS — O4700 False labor before 37 completed weeks of gestation, unspecified trimester: Secondary | ICD-10-CM | POA: Insufficient documentation

## 2013-03-22 DIAGNOSIS — O479 False labor, unspecified: Secondary | ICD-10-CM

## 2013-03-22 DIAGNOSIS — IMO0002 Reserved for concepts with insufficient information to code with codable children: Secondary | ICD-10-CM

## 2013-03-22 DIAGNOSIS — O228X9 Other venous complications in pregnancy, unspecified trimester: Secondary | ICD-10-CM | POA: Insufficient documentation

## 2013-03-22 LAB — URINALYSIS, ROUTINE W REFLEX MICROSCOPIC
Bilirubin Urine: NEGATIVE
Glucose, UA: NEGATIVE mg/dL
Ketones, ur: NEGATIVE mg/dL
Nitrite: NEGATIVE
Protein, ur: NEGATIVE mg/dL
Specific Gravity, Urine: 1.015 (ref 1.005–1.030)
Urobilinogen, UA: 0.2 mg/dL (ref 0.0–1.0)
pH: 7 (ref 5.0–8.0)

## 2013-03-22 LAB — URINE MICROSCOPIC-ADD ON

## 2013-03-22 LAB — FETAL FIBRONECTIN: Fetal Fibronectin: NEGATIVE

## 2013-03-22 NOTE — Discharge Instructions (Signed)
Preventing Preterm Labor Preterm labor is when a pregnant woman has contractions that cause the cervix to open, shorten, and thin before 37 weeks of pregnancy. You will have regular contractions (tightening) 2 to 3 minutes apart. This usually causes discomfort or pain. HOME CARE  Eat a healthy diet.  Take your vitamins as told by your doctor.  Drink enough fluids to keep your pee (urine) clear or pale yellow every day.  Get rest and sleep.  Do not have sex if you are at high risk for preterm labor.  Follow your doctor's advice about activity, medicines, and tests.  Avoid stress.  Avoid hard labor or exercise that lasts for a long time.  Do not smoke. GET HELP RIGHT AWAY IF:   You are having contractions.  You have belly (abdominal) pain.  You have bleeding from your vagina.  You have pain when you pee (urinate).  You have abnormal discharge from your vagina.  You have a temperature by mouth above 102 F (38.9 C). MAKE SURE YOU:  Understand these instructions.  Will watch your condition.  Will get help if you are not doing well or get worse. Document Released: 10/01/2008 Document Revised: 09/27/2011 Document Reviewed: 10/01/2008 [employer] Patient Information 2014 [employer].  

## 2013-03-22 NOTE — Progress Notes (Signed)
4

## 2013-03-22 NOTE — MAU Note (Signed)
Patient states she has had contractions on and off since last night. Now having contractions every 2-5 minutes. Feels like getting a cold and doesn't feel well. Pain with hemorrhoids. Denies bleeding or leaking. Reports  fetal movement but not as much as usual. Has diarrhea 4-5 times a day because of iron she is taking.

## 2013-03-22 NOTE — Progress Notes (Signed)
Pt states last time she had a contraction was about 15- 103min.s ago

## 2013-03-22 NOTE — MAU Note (Signed)
Pt states she started having abdominal pain last night . Pt states she was able to go to sleep and woke up with them this morning. Contractions went away and came back.

## 2013-03-22 NOTE — MAU Provider Note (Signed)
History   22yo, G2P0000 at [redacted]w[redacted]d. Per Dr. Su Hilt pt presented for PTL evaluation.  No UCs since admission.  FFN was neg.  Denies VB, UCs, LOF, recent fever, resp or GI c/o's, UTI or PIH s/s. GFM.   Chief Complaint  Patient presents with  . Labor Eval    OB History   Grav Para Term Preterm Abortions TAB SAB Ect Mult Living   2    1 1     0      Past Medical History  Diagnosis Date  . Anemia   . Infection     uti    Past Surgical History  Procedure Laterality Date  . Cholecystectomy    . Elbow fracture surgery      BROKEN HUMEROUS  . Wisdom tooth extraction      Family History  Problem Relation Age of Onset  . Heart disease Paternal Grandmother   . Heart disease Maternal Grandmother   . Diabetes Maternal Grandmother   . Arthritis Father   . Hypertension Father   . Anemia Father   . Arthritis Mother   . Hypertension Mother     History  Substance Use Topics  . Smoking status: Former Smoker -- 0.10 packs/day for 2 years    Types: Cigarettes  . Smokeless tobacco: Never Used     Comment: quit after found out preg  . Alcohol Use: No     Comment: OCCASIONAL    Allergies:  Allergies  Allergen Reactions  . Nickel Hives, Itching and Rash  . Other Hives, Itching and Rash    PT IS ALLERGIC TO GRASS    Prescriptions prior to admission  Medication Sig Dispense Refill  . calcium carbonate (TUMS - DOSED IN MG ELEMENTAL CALCIUM) 500 MG chewable tablet Chew 2 tablets by mouth 3 (three) times daily as needed for heartburn.      . cholecalciferol (VITAMIN D) 1000 UNITS tablet Take 2,000 Units by mouth daily.      . IRON PO Take 2 tablets by mouth daily.      . Prenatal Vit-Fe Fumarate-FA (PRENATAL MULTIVITAMIN) TABS tablet Take 1 tablet by mouth daily at 12 noon.        ROS: see HPI above, all other systems are negative   Physical Exam   Blood pressure 137/77, pulse 115, temperature 98.8 F (37.1 C), temperature source Oral, resp. rate 20, height 5\' 3"  (1.6 m),  weight 267 lb 12.8 oz (121.473 kg), last menstrual period 07/02/2012, SpO2 99.00%.  Dilation: Closed Effacement (%): Thick Cervical Position: Posterior  FHT: Appropriate for GA UCs: None  Results for orders placed during the hospital encounter of 03/22/13 (from the past 24 hour(s))  FETAL FIBRONECTIN     Status: None   Collection Time    03/22/13  5:53 PM      Result Value Range   Fetal Fibronectin NEGATIVE  NEGATIVE  URINALYSIS, ROUTINE W REFLEX MICROSCOPIC     Status: Abnormal   Collection Time    03/22/13  7:21 PM      Result Value Range   Color, Urine YELLOW  YELLOW   APPearance CLEAR  CLEAR   Specific Gravity, Urine 1.015  1.005 - 1.030   pH 7.0  5.0 - 8.0   Glucose, UA NEGATIVE  NEGATIVE mg/dL   Hgb urine dipstick LARGE (*) NEGATIVE   Bilirubin Urine NEGATIVE  NEGATIVE   Ketones, ur NEGATIVE  NEGATIVE mg/dL   Protein, ur NEGATIVE  NEGATIVE mg/dL   Urobilinogen, UA  0.2  0.0 - 1.0 mg/dL   Nitrite NEGATIVE  NEGATIVE   Leukocytes, UA TRACE (*) NEGATIVE  URINE MICROSCOPIC-ADD ON     Status: Abnormal   Collection Time    03/22/13  7:21 PM      Result Value Range   Squamous Epithelial / LPF FEW (*) RARE   WBC, UA 0-2  <3 WBC/hpf   RBC / HPF TOO NUMEROUS TO COUNT  <3 RBC/hpf   Bacteria, UA FEW (*) RARE    ED Course  IUP at [redacted]w[redacted]d PTL evaluation  FFN  UA   D/c home with precautions Keep already scheduled ROB appointment 9/11    Haroldine Laws CNM, MSN 03/22/2013 7:29 PM

## 2013-04-30 ENCOUNTER — Encounter (HOSPITAL_COMMUNITY): Payer: Self-pay | Admitting: General Practice

## 2013-04-30 ENCOUNTER — Inpatient Hospital Stay (HOSPITAL_COMMUNITY)
Admission: AD | Admit: 2013-04-30 | Discharge: 2013-04-30 | Disposition: A | Discharge status: Home / Self Care | Payer: BC Managed Care – PPO | Source: Ambulatory Visit | Attending: Obstetrics and Gynecology | Admitting: Obstetrics and Gynecology

## 2013-04-30 DIAGNOSIS — O99891 Other specified diseases and conditions complicating pregnancy: Secondary | ICD-10-CM | POA: Insufficient documentation

## 2013-04-30 DIAGNOSIS — R0602 Shortness of breath: Secondary | ICD-10-CM | POA: Insufficient documentation

## 2013-04-30 LAB — URINALYSIS, ROUTINE W REFLEX MICROSCOPIC
Bilirubin Urine: NEGATIVE
Glucose, UA: NEGATIVE mg/dL
Ketones, ur: NEGATIVE mg/dL
Nitrite: NEGATIVE
Protein, ur: NEGATIVE mg/dL
Specific Gravity, Urine: 1.015 (ref 1.005–1.030)
Urobilinogen, UA: 0.2 mg/dL (ref 0.0–1.0)
pH: 7.5 (ref 5.0–8.0)

## 2013-04-30 LAB — URINE MICROSCOPIC-ADD ON

## 2013-04-30 NOTE — Discharge Instructions (Signed)
Preterm Labor  Preterm labor is when labor starts at less than 37 weeks of pregnancy. The normal length of a pregnancy is 39 to 41 weeks.  CAUSES  Often, there is no identifiable underlying cause as to why a woman goes into preterm labor. However, one of the most common known causes of preterm labor is infection. Infections of the uterus, cervix, vagina, amniotic sac, bladder, kidney, or even the lungs (pneumonia) can cause labor to start. Other causes of preterm labor include:   Urogenital infections, such as yeast infections and bacterial vaginosis.   Uterine abnormalities (uterine shape, uterine septum, fibroids, bleeding from the placenta).   A cervix that has been operated on and opens prematurely.   Malformations in the baby.   Multiple gestations (twins, triplets, and so on).   Breakage of the amniotic sac.  Additional risk factors for preterm labor include:   Previous history of preterm labor.   Premature rupture of membranes (PROM).   A placenta that covers the opening of the cervix (placenta previa).   A placenta that separates from the uterus (placenta abruption).   A cervix that is too weak to hold the baby in the uterus (incompetence cervix).   Having too much fluid in the amniotic sac (polyhydramnios).   Taking illegal drugs or smoking while pregnant.   Not gaining enough weight while pregnant.   Women younger than 18 and older than 21 years old.   Low socioeconomic status.   African-American ethnicity.  SYMPTOMS  Signs and symptoms of preterm labor include:   Menstrual-like cramps.   Contractions that are 30 to 70 seconds apart, become very regular, closer together, and are more intense and painful.   Contractions that start on the top of the uterus and spread down to the lower abdomen and back.   A sense of increased pelvic pressure or back pain.   A watery or bloody discharge that comes from the vagina.  DIAGNOSIS   A diagnosis can be confirmed by:   A vaginal exam.    An ultrasound of the cervix.   Sampling (swabbing) cervico-vaginal secretions. These samples can be tested for the presence of fetal fibronectin. This is a protein found in cervical discharge which is associated with preterm labor.   Fetal monitoring.  TREATMENT   Depending on the length of the pregnancy and other circumstances, a caregiver may suggest bed rest. If necessary, there are medicines that can be given to stop contractions and to quicken fetal lung maturity. If labor happens before 34 weeks of pregnancy, a prolonged hospital stay may be recommended. Treatment depends on the condition of both the mother and baby.  PREVENTION  There are some things a mother can do to lower the risk of preterm labor in future pregnancies. A woman can:    Stop smoking.   Maintain healthy weight gain and avoid chemicals and drugs that are not necessary.   Be watchful for any type of infection.   Inform her caregiver if she has a known history of preterm labor.  Document Released: 09/25/2003 Document Revised: 09/27/2011 Document Reviewed: 10/30/2010  [employer] Patient Information 2014 [employer].

## 2013-04-30 NOTE — MAU Note (Signed)
Having trouble breathing, off and on, getting short of breath more often.  Thinks is having anxiety attacks.  Was having contractions today, crampy, feels like it is knocking the breath out of her.

## 2013-05-10 LAB — OB RESULTS CONSOLE GC/CHLAMYDIA
Chlamydia: NEGATIVE
Gonorrhea: NEGATIVE

## 2013-05-10 LAB — OB RESULTS CONSOLE GBS: GBS: NEGATIVE

## 2013-05-13 ENCOUNTER — Inpatient Hospital Stay (HOSPITAL_COMMUNITY)
Admission: AD | Admit: 2013-05-13 | Discharge: 2013-05-13 | Disposition: A | Discharge status: Home / Self Care | Payer: BC Managed Care – PPO | Source: Ambulatory Visit | Attending: Obstetrics and Gynecology | Admitting: Obstetrics and Gynecology

## 2013-05-13 DIAGNOSIS — O99891 Other specified diseases and conditions complicating pregnancy: Secondary | ICD-10-CM | POA: Insufficient documentation

## 2013-05-13 LAB — AMNISURE RUPTURE OF MEMBRANE (ROM) NOT AT ARMC: Amnisure ROM: NEGATIVE

## 2013-05-13 NOTE — Discharge Instructions (Signed)
Braxton Hicks Contractions °Pregnancy is commonly associated with contractions of the uterus throughout the pregnancy. Towards the end of pregnancy (32 to 34 weeks), these contractions (Braxton Hicks) can develop more often and may become more forceful. This is not true labor because these contractions do not result in opening (dilatation) and thinning of the cervix. They are sometimes difficult to tell apart from true labor because these contractions can be forceful and people have different pain tolerances. You should not feel embarrassed if you go to the hospital with false labor. Sometimes, the only way to tell if you are in true labor is for your caregiver to follow the changes in the cervix. °How to tell the difference between true and false labor: °· False labor. °· The contractions of false labor are usually shorter, irregular and not as hard as those of true labor. °· They are often felt in the front of the lower abdomen and in the groin. °· They may leave with walking around or changing positions while lying down. °· They get weaker and are shorter lasting as time goes on. °· These contractions are usually irregular. °· They do not usually become progressively stronger, regular and closer together as with true labor. °· True labor. °· Contractions in true labor last 30 to 70 seconds, become very regular, usually become more intense, and increase in frequency. °· They do not go away with walking. °· The discomfort is usually felt in the top of the uterus and spreads to the lower abdomen and low back. °· True labor can be determined by your caregiver with an exam. This will show that the cervix is dilating and getting thinner. °If there are no prenatal problems or other health problems associated with the pregnancy, it is completely safe to be sent home with false labor and await the onset of true labor. °HOME CARE INSTRUCTIONS  °· Keep up with your usual exercises and instructions. °· Take medications as  directed. °· Keep your regular prenatal appointment. °· Eat and drink lightly if you think you are going into labor. °· If BH contractions are making you uncomfortable: °· Change your activity position from lying down or resting to walking/walking to resting. °· Sit and rest in a tub of warm water. °· Drink 2 to 3 glasses of water. Dehydration may cause B-H contractions. °· Do slow and deep breathing several times an hour. °SEEK IMMEDIATE MEDICAL CARE IF:  °· Your contractions continue to become stronger, more regular, and closer together. °· You have a gushing, burst or leaking of fluid from the vagina. °· An oral temperature above 102° F (38.9° C) develops. °· You have passage of blood-tinged mucus. °· You develop vaginal bleeding. °· You develop continuous belly (abdominal) pain. °· You have low back pain that you never had before. °· You feel the baby's head pushing down causing pelvic pressure. °· The baby is not moving as much as it used to. °Document Released: 07/05/2005 Document Revised: 09/27/2011 Document Reviewed: 12/27/2008 °ExitCare® Patient Information ©2014 [employer]. ° ° °Fetal Movement Counts °Patient Name: __________________________________________________ Patient Due Date: ____________________ °Performing a fetal movement count is highly recommended in high-risk pregnancies, but it is good for every pregnant woman to do. Your caregiver may ask you to start counting fetal movements at 28 weeks of the pregnancy. Fetal movements often increase: °· After eating a full meal. °· After physical activity. °· After eating or drinking something sweet or cold. °· At rest. °Pay attention to when you   the baby is most active. This will help you notice a pattern of your baby's sleep and wake cycles and what factors contribute to an increase in fetal movement. It is important to perform a fetal movement count at the same time each day when your baby is normally most active.  HOW TO COUNT FETAL  MOVEMENTS 1. Find a quiet and comfortable area to sit or lie down on your left side. Lying on your left side provides the best blood and oxygen circulation to your baby. 2. Write down the day and time on a sheet of paper or in a journal. 3. Start counting kicks, flutters, swishes, rolls, or jabs in a 2 hour period. You should feel at least 10 movements within 2 hours. 4. If you do not feel 10 movements in 2 hours, wait 2 3 hours and count again. Look for a change in the pattern or not enough counts in 2 hours. SEEK MEDICAL CARE IF:  You feel less than 10 counts in 2 hours, tried twice.  There is no movement in over an hour.  The pattern is changing or taking longer each day to reach 10 counts in 2 hours.  You feel the baby is not moving as he or she usually does. Date: ____________ Movements: ____________ Start time: ____________ Finish time: ____________  Date: ____________ Movements: ____________ Start time: ____________ Finish time: ____________ Date: ____________ Movements: ____________ Start time: ____________ Finish time: ____________ Date: ____________ Movements: ____________ Start time: ____________ Finish time: ____________ Date: ____________ Movements: ____________ Start time: ____________ Finish time: ____________ Date: ____________ Movements: ____________ Start time: ____________ Finish time: ____________ Date: ____________ Movements: ____________ Start time: ____________ Finish time: ____________ Date: ____________ Movements: ____________ Start time: ____________ Finish time: ____________  Date: ____________ Movements: ____________ Start time: ____________ Finish time: ____________ Date: ____________ Movements: ____________ Start time: ____________ Finish time: ____________ Date: ____________ Movements: ____________ Start time: ____________ Finish time: ____________ Date: ____________ Movements: ____________ Start time: ____________ Finish time: ____________ Date: ____________  Movements: ____________ Start time: ____________ Finish time: ____________ Date: ____________ Movements: ____________ Start time: ____________ Finish time: ____________ Date: ____________ Movements: ____________ Start time: ____________ Finish time: ____________  Date: ____________ Movements: ____________ Start time: ____________ Finish time: ____________ Date: ____________ Movements: ____________ Start time: ____________ Finish time: ____________ Date: ____________ Movements: ____________ Start time: ____________ Finish time: ____________ Date: ____________ Movements: ____________ Start time: ____________ Finish time: ____________ Date: ____________ Movements: ____________ Start time: ____________ Finish time: ____________ Date: ____________ Movements: ____________ Start time: ____________ Finish time: ____________ Date: ____________ Movements: ____________ Start time: ____________ Finish time: ____________  Date: ____________ Movements: ____________ Start time: ____________ Finish time: ____________ Date: ____________ Movements: ____________ Start time: ____________ Finish time: ____________ Date: ____________ Movements: ____________ Start time: ____________ Finish time: ____________ Date: ____________ Movements: ____________ Start time: ____________ Finish time: ____________ Date: ____________ Movements: ____________ Start time: ____________ Finish time: ____________ Date: ____________ Movements: ____________ Start time: ____________ Finish time: ____________ Date: ____________ Movements: ____________ Start time: ____________ Finish time: ____________  Date: ____________ Movements: ____________ Start time: ____________ Finish time: ____________ Date: ____________ Movements: ____________ Start time: ____________ Finish time: ____________ Date: ____________ Movements: ____________ Start time: ____________ Finish time: ____________ Date: ____________ Movements: ____________ Start time:  ____________ Finish time: ____________ Date: ____________ Movements: ____________ Start time: ____________ Finish time: ____________ Date: ____________ Movements: ____________ Start time: ____________ Finish time: ____________ Date: ____________ Movements: ____________ Start time: ____________ Finish time: ____________  Date: ____________ Movements: ____________ Start time: ____________ Finish time: ____________ Date: ____________ Movements: ____________ Start   time: ____________ Finish time: ____________ Date: ____________ Movements: ____________ Start time: ____________ Finish time: ____________ Date: ____________ Movements: ____________ Start time: ____________ Finish time: ____________ Date: ____________ Movements: ____________ Start time: ____________ Finish time: ____________ Date: ____________ Movements: ____________ Start time: ____________ Finish time: ____________ Date: ____________ Movements: ____________ Start time: ____________ Finish time: ____________  Date: ____________ Movements: ____________ Start time: ____________ Finish time: ____________ Date: ____________ Movements: ____________ Start time: ____________ Finish time: ____________ Date: ____________ Movements: ____________ Start time: ____________ Finish time: ____________ Date: ____________ Movements: ____________ Start time: ____________ Finish time: ____________ Date: ____________ Movements: ____________ Start time: ____________ Finish time: ____________ Date: ____________ Movements: ____________ Start time: ____________ Finish time: ____________ Date: ____________ Movements: ____________ Start time: ____________ Finish time: ____________  Date: ____________ Movements: ____________ Start time: ____________ Finish time: ____________ Date: ____________ Movements: ____________ Start time: ____________ Finish time: ____________ Date: ____________ Movements: ____________ Start time: ____________ Finish time: ____________ Date:  ____________ Movements: ____________ Start time: ____________ Finish time: ____________ Date: ____________ Movements: ____________ Start time: ____________ Finish time: ____________ Date: ____________ Movements: ____________ Start time: ____________ Finish time: ____________ Document Released: 08/04/2006 Document Revised: 06/21/2012 Document Reviewed: 05/01/2012 ExitCare Patient Information 2014 [employer].  

## 2013-05-13 NOTE — MAU Provider Note (Signed)
History     CSN: [id number]  Arrival date and time: 05/13/13 4098   None     Chief Complaint  Patient presents with  . Rupture of Membranes    Thinks water broke at 0400 today. Using bathroom after having sex, able to pee found panties to be very wet.   HPI Comments: Pt is a G2P0 at [redacted]w[redacted]d arrives unannounced w c/o ?LOF, denies any VB, occ ctx, GFM. States she had IC this AM, and noticed more wetness since that time.      Past Medical History  Diagnosis Date  . Anemia   . Infection     uti    Past Surgical History  Procedure Laterality Date  . Cholecystectomy    . Elbow fracture surgery      BROKEN HUMEROUS  . Wisdom tooth extraction      Family History  Problem Relation Age of Onset  . Heart disease Paternal Grandmother   . Heart disease Maternal Grandmother   . Diabetes Maternal Grandmother   . Arthritis Father   . Hypertension Father   . Anemia Father   . Arthritis Mother   . Hypertension Mother     History  Substance Use Topics  . Smoking status: Former Smoker -- 0.10 packs/day for 2 years    Types: Cigarettes  . Smokeless tobacco: Never Used     Comment: quit after found out preg  . Alcohol Use: No     Comment: OCCASIONAL    Allergies:  Allergies  Allergen Reactions  . Nickel Hives, Itching and Rash  . Other Hives, Itching and Rash    PT IS ALLERGIC TO GRASS    Prescriptions prior to admission  Medication Sig Dispense Refill  . acetaminophen (TYLENOL) 500 MG tablet Take 1,000 mg by mouth every 6 (six) hours as needed for pain (For headache.).      Marland Kitchen calcium carbonate (TUMS - DOSED IN MG ELEMENTAL CALCIUM) 500 MG chewable tablet Chew 2 tablets by mouth 3 (three) times daily as needed for heartburn.      . cholecalciferol (VITAMIN D) 1000 UNITS tablet Take 1,000 Units by mouth daily.       . IRON PO Take 1 tablet by mouth daily.       . Prenatal Vit-Fe Fumarate-FA (PRENATAL MULTIVITAMIN) TABS tablet Take 1 tablet by mouth daily at 12 noon.         Review of Systems  All other systems reviewed and are negative.   Physical Exam   Blood pressure 135/76, pulse 108, temperature 98.1 F (36.7 C), temperature source Oral, resp. rate 20, height 5\' 4"  (1.626 m), weight 271 lb (122.925 kg), last menstrual period 07/02/2012.  Physical Exam  Nursing note and vitals reviewed. Constitutional: She is oriented to person, place, and time. She appears well-developed and well-nourished.  HENT:  Head: Normocephalic.  Cardiovascular: Normal rate.   Respiratory: Effort normal.  GI: Soft.  Genitourinary: Vagina normal.  Cervix =1/th/high  Spec exam, neg pooling, neg fern, neg amnisure   Musculoskeletal: Normal range of motion.  Neurological: She is alert and oriented to person, place, and time. She has normal reflexes.  Skin: Skin is warm and dry.  Psychiatric: She has a normal mood and affect. Her behavior is normal.    MAU Course  Procedures    Assessment and Plan  IUP at [redacted]w[redacted]d GBS neg No evidence of SROM amnisure neg  Dc home, rv'd FKC and labor sx's Keep OV next week  Amariz Flamenco M 05/13/2013, 5:24 PM

## 2013-05-26 ENCOUNTER — Encounter (HOSPITAL_COMMUNITY): Payer: Self-pay | Admitting: *Deleted

## 2013-05-26 ENCOUNTER — Inpatient Hospital Stay (HOSPITAL_COMMUNITY)
Admission: AD | Admit: 2013-05-26 | Discharge: 2013-05-26 | Disposition: A | Discharge status: Home / Self Care | Payer: BC Managed Care – PPO | Source: Ambulatory Visit | Attending: Obstetrics and Gynecology | Admitting: Obstetrics and Gynecology

## 2013-05-26 DIAGNOSIS — O368190 Decreased fetal movements, unspecified trimester, not applicable or unspecified: Secondary | ICD-10-CM | POA: Insufficient documentation

## 2013-05-26 DIAGNOSIS — M545 Low back pain, unspecified: Secondary | ICD-10-CM | POA: Insufficient documentation

## 2013-05-26 DIAGNOSIS — O471 False labor at or after 37 completed weeks of gestation: Secondary | ICD-10-CM

## 2013-05-26 DIAGNOSIS — O479 False labor, unspecified: Secondary | ICD-10-CM | POA: Insufficient documentation

## 2013-05-26 DIAGNOSIS — R319 Hematuria, unspecified: Secondary | ICD-10-CM | POA: Insufficient documentation

## 2013-05-26 DIAGNOSIS — O368130 Decreased fetal movements, third trimester, not applicable or unspecified: Secondary | ICD-10-CM

## 2013-05-26 LAB — URINALYSIS, ROUTINE W REFLEX MICROSCOPIC
Bilirubin Urine: NEGATIVE
Glucose, UA: NEGATIVE mg/dL
Ketones, ur: NEGATIVE mg/dL
Nitrite: NEGATIVE
Protein, ur: NEGATIVE mg/dL
Specific Gravity, Urine: <1.005 — ABNORMAL LOW (ref 1.005–1.030)
Urobilinogen, UA: 0.2 mg/dL (ref 0.0–1.0)
pH: 6.5 (ref 5.0–8.0)

## 2013-05-26 LAB — URINE MICROSCOPIC-ADD ON

## 2013-05-26 MED ORDER — NITROFURANTOIN MONOHYD MACRO 100 MG PO CAPS: 100.0000 mg | ORAL_CAPSULE | Freq: Two times a day (BID) | ORAL | Status: DC

## 2013-05-26 NOTE — MAU Provider Note (Signed)
History    CSN: [id number]  Arrival date and time: 05/26/13 2058   Chief Complaint  Patient presents with  . Contractions  . Decreased Fetal Movement   HPI Pt presents unannounced to MAU at 38w 4d with c/o of contractions since approx 3:00pm today which were increasing in frequency and intensity since onset as well as persistent increased pelvic pressure and constant low back pain.  Denies ROM or bldg.  States fetus was not as active this AM as usual but has been moving normally this afternoon.  States UCs were approximately every 5-7 minutes and she was unable to walk to talk through however the contractions have now decreased in frequency and intensity since arrival at [facility].    OB History   Grav Para Term Preterm Abortions TAB SAB Ect Mult Living   2    1 1     0      Past Medical History  Diagnosis Date  . Anemia   . Infection     uti    Past Surgical History  Procedure Laterality Date  . Cholecystectomy    . Elbow fracture surgery      BROKEN HUMEROUS  . Wisdom tooth extraction      Family History  Problem Relation Age of Onset  . Heart disease Paternal Grandmother   . Heart disease Maternal Grandmother   . Diabetes Maternal Grandmother   . Arthritis Father   . Hypertension Father   . Anemia Father   . Arthritis Mother   . Hypertension Mother     History  Substance Use Topics  . Smoking status: Former Smoker -- 0.10 packs/day for 2 years    Types: Cigarettes  . Smokeless tobacco: Never Used     Comment: quit after found out preg  . Alcohol Use: No     Comment: OCCASIONAL    Allergies:  Allergies  Allergen Reactions  . Nickel Hives, Itching and Rash  . Other Hives, Itching and Rash    PT IS ALLERGIC TO GRASS    Prescriptions prior to admission  Medication Sig Dispense Refill  . calcium carbonate (TUMS - DOSED IN MG ELEMENTAL CALCIUM) 500 MG chewable tablet Chew 2 tablets by mouth 3 (three) times daily as needed for heartburn.      .  cholecalciferol (VITAMIN D) 1000 UNITS tablet Take 1,000 Units by mouth daily.       . Prenatal Vit-Fe Fumarate-FA (PRENATAL MULTIVITAMIN) TABS tablet Take 1 tablet by mouth daily at 12 noon.      . ranitidine (ZANTAC) 150 MG capsule Take 150 mg by mouth 2 (two) times daily.        Review of Systems  Constitutional: Negative.   HENT: Negative.   Eyes: Negative.   Respiratory: Negative.   Cardiovascular: Negative.   Gastrointestinal: Negative.   Genitourinary: Negative.   Musculoskeletal: Negative.   Skin: Negative.   Neurological: Negative.   Endo/Heme/Allergies: Negative.   Psychiatric/Behavioral: Negative.    Physical Exam   Blood pressure 128/63, pulse 118, temperature 98.4 F (36.9 C), resp. rate 20, height 5\' 4"  (1.626 m), weight 123.923 kg (273 lb 3.2 oz), last menstrual period 07/02/2012.  Physical Exam  Constitutional: She is oriented to person, place, and time. She appears well-developed and well-nourished.  HENT:  Head: Normocephalic and atraumatic.  Right Ear: External ear normal.  Left Ear: External ear normal.  Nose: Nose normal.  Eyes: Conjunctivae are normal. Pupils are equal, round, and reactive to light.  Neck: Normal  range of motion. Neck supple.  Cardiovascular: Normal rate, regular rhythm and intact distal pulses.   Respiratory: Effort normal and breath sounds normal.  GI: Soft. Bowel sounds are normal. She exhibits no distension. There is no tenderness.  Gravid  Genitourinary: Vagina normal and uterus normal.  SVE: 2cm/50%/-3/vtx/post/medium consistency. No bloody show noted.  Musculoskeletal: Normal range of motion. She exhibits no edema.  Neurological: She is alert and oriented to person, place, and time. She has normal reflexes.  Skin: Skin is warm and dry.  Psychiatric: She has a normal mood and affect. Her behavior is normal.   FHR baseline: 130bpm; Variability-moderate; accels-present; decels-absent.  FHR Cat 1.  Reactive NST. Irreg UCs noted  every 5-10 minutes, mild to palpation.   MAU Course  Procedures Results for orders placed during the hospital encounter of 05/26/13 (from the past 24 hour(s))  URINALYSIS, ROUTINE W REFLEX MICROSCOPIC     Status: Abnormal   Collection Time    05/26/13  9:15 PM      Result Value Range   Color, Urine YELLOW  YELLOW   APPearance CLEAR  CLEAR   Specific Gravity, Urine <1.005 (*) 1.005 - 1.030   pH 6.5  5.0 - 8.0   Glucose, UA NEGATIVE  NEGATIVE mg/dL   Hgb urine dipstick MODERATE (*) NEGATIVE   Bilirubin Urine NEGATIVE  NEGATIVE   Ketones, ur NEGATIVE  NEGATIVE mg/dL   Protein, ur NEGATIVE  NEGATIVE mg/dL   Urobilinogen, UA 0.2  0.0 - 1.0 mg/dL   Nitrite NEGATIVE  NEGATIVE   Leukocytes, UA TRACE (*) NEGATIVE  URINE MICROSCOPIC-ADD ON     Status: Abnormal   Collection Time    05/26/13  9:15 PM      Result Value Range   Squamous Epithelial / LPF FEW (*) RARE   WBC, UA 3-6  <3 WBC/hpf   RBC / HPF 3-6  <3 RBC/hpf   Bacteria, UA RARE  RARE    Assessment and Plan  IUP at 38w 4d False Labor Hematuria Decreased fetal movement-resolved. Back pain  Urine sent for culture.  Will empirically tx for UTI with Macrobid 1 cap po bid x 7 days. Pt declines rx for Flexeril for back pain Rev s/s labor and fetal kick counts.   F/U in office on Wed, 05/30/13 as previously scheduled.    Romonia Yanik O. 05/26/2013, 10:03 PM

## 2013-05-26 NOTE — MAU Note (Signed)
PT SAYS   SHE STARTED FEELING  UC HURT  AT 3PM.     VE IN OFFICE  WAS 2 CM- Thursday.  DENIES HSV AND MRSA.

## 2013-05-26 NOTE — MAU Note (Signed)
Contractions for 2-3 days. More consistent now and having back pain. Having a lot of pelvic pressure and hurts to urinate. Diarrhea x 2. Just feel like crap all day

## 2013-05-29 LAB — CULTURE, OB URINE
Colony Count: 50000
Special Requests: NORMAL

## 2013-05-30 ENCOUNTER — Inpatient Hospital Stay (HOSPITAL_COMMUNITY)
Admission: AD | Admit: 2013-05-30 | Discharge: 2013-06-03 | DRG: 774 | Disposition: A | Discharge status: Home / Self Care | Payer: BC Managed Care – PPO | Source: Ambulatory Visit | Attending: Obstetrics and Gynecology | Admitting: Obstetrics and Gynecology

## 2013-05-30 ENCOUNTER — Encounter (HOSPITAL_COMMUNITY): Payer: Self-pay | Admitting: *Deleted

## 2013-05-30 DIAGNOSIS — E559 Vitamin D deficiency, unspecified: Secondary | ICD-10-CM | POA: Diagnosis not present

## 2013-05-30 DIAGNOSIS — Z6841 Body Mass Index (BMI) 40.0 and over, adult: Secondary | ICD-10-CM

## 2013-05-30 DIAGNOSIS — O99334 Smoking (tobacco) complicating childbirth: Secondary | ICD-10-CM | POA: Diagnosis present

## 2013-05-30 DIAGNOSIS — E669 Obesity, unspecified: Secondary | ICD-10-CM | POA: Diagnosis present

## 2013-05-30 DIAGNOSIS — F17200 Nicotine dependence, unspecified, uncomplicated: Secondary | ICD-10-CM | POA: Diagnosis present

## 2013-05-30 DIAGNOSIS — O139 Gestational [pregnancy-induced] hypertension without significant proteinuria, unspecified trimester: Principal | ICD-10-CM | POA: Diagnosis present

## 2013-05-30 DIAGNOSIS — O480 Post-term pregnancy: Secondary | ICD-10-CM | POA: Diagnosis present

## 2013-05-30 LAB — COMPREHENSIVE METABOLIC PANEL
ALT: 14 U/L (ref 0–35)
Alkaline Phosphatase: 208 U/L — ABNORMAL HIGH (ref 39–117)
BUN: 6 mg/dL (ref 6–23)
CO2: 15 mEq/L — ABNORMAL LOW (ref 19–32)
Chloride: 104 mEq/L (ref 96–112)
Creatinine, Ser: 0.61 mg/dL (ref 0.50–1.10)
GFR calc Af Amer: >90 mL/min (ref >90)
GFR calc non Af Amer: >90 mL/min (ref >90)
Glucose, Bld: 83 mg/dL (ref 70–99)
Potassium: 3.7 mEq/L (ref 3.5–5.1)
Sodium: 132 mEq/L — ABNORMAL LOW (ref 135–145)
Total Bilirubin: 0.4 mg/dL (ref 0.3–1.2)
Total Protein: 7.2 g/dL (ref 6.0–8.3)

## 2013-05-30 LAB — COMPREHENSIVE METABOLIC PANEL WITH GFR
AST: 14 U/L (ref 0–37)
Albumin: 2.7 g/dL — ABNORMAL LOW (ref 3.5–5.2)
Calcium: 9.8 mg/dL (ref 8.4–10.5)

## 2013-05-30 LAB — URINALYSIS, ROUTINE W REFLEX MICROSCOPIC
Bilirubin Urine: NEGATIVE
Glucose, UA: NEGATIVE mg/dL
Ketones, ur: NEGATIVE mg/dL
Nitrite: NEGATIVE
Protein, ur: NEGATIVE mg/dL
Specific Gravity, Urine: 1.01 (ref 1.005–1.030)
Urobilinogen, UA: 0.2 mg/dL (ref 0.0–1.0)
pH: 6.5 (ref 5.0–8.0)

## 2013-05-30 LAB — CBC
HCT: 39.8 % (ref 36.0–46.0)
Hemoglobin: 13.6 g/dL (ref 12.0–15.0)
MCH: 26 pg (ref 26.0–34.0)
MCHC: 34.2 g/dL (ref 30.0–36.0)
MCV: 76 fL — ABNORMAL LOW (ref 78.0–100.0)
Platelets: 285 K/uL (ref 150–400)
RBC: 5.24 MIL/uL — ABNORMAL HIGH (ref 3.87–5.11)
RDW: 14.7 % (ref 11.5–15.5)
WBC: 9.8 K/uL (ref 4.0–10.5)

## 2013-05-30 LAB — URINE MICROSCOPIC-ADD ON

## 2013-05-30 LAB — TYPE AND SCREEN
ABO/RH(D): A POS
Antibody Screen: NEGATIVE

## 2013-05-30 LAB — PROTEIN / CREATININE RATIO, URINE
Creatinine, Urine: 47.48 mg/dL
Protein Creatinine Ratio: 0.18 — ABNORMAL HIGH (ref 0.00–0.15)
Total Protein, Urine: 8.7 mg/dL

## 2013-05-30 LAB — LACTATE DEHYDROGENASE: LDH: 141 U/L (ref 94–250)

## 2013-05-30 LAB — ABO/RH: ABO/RH(D): A POS

## 2013-05-30 LAB — URIC ACID: Uric Acid, Serum: 7.2 mg/dL — ABNORMAL HIGH (ref 2.4–7.0)

## 2013-05-30 MED ORDER — CITRIC ACID-SODIUM CITRATE 334-500 MG/5ML PO SOLN: 30.0000 mL | ORAL | Status: DC | PRN

## 2013-05-30 MED ORDER — EPHEDRINE 5 MG/ML INJ
10.0000 mg | INTRAVENOUS | Status: DC | PRN
  Filled 2013-05-30: qty 2
  Filled 2013-05-30: qty 4

## 2013-05-30 MED ORDER — PHENYLEPHRINE 40 MCG/ML (10ML) SYRINGE FOR IV PUSH (FOR BLOOD PRESSURE SUPPORT)
80.0000 ug | PREFILLED_SYRINGE | INTRAVENOUS | Status: DC | PRN
  Filled 2013-05-30: qty 2

## 2013-05-30 MED ORDER — ONDANSETRON HCL 4 MG/2ML IJ SOLN: 4.0000 mg | Freq: Four times a day (QID) | INTRAMUSCULAR | Status: DC | PRN

## 2013-05-30 MED ORDER — ZOLPIDEM TARTRATE 5 MG PO TABS
5.0000 mg | ORAL_TABLET | Freq: Every evening | ORAL | Status: DC | PRN
  Administered 2013-05-30: 5 mg via ORAL
  Filled 2013-05-30: qty 1

## 2013-05-30 MED ORDER — LIDOCAINE HCL (PF) 1 % IJ SOLN
30.0000 mL | INTRAMUSCULAR | Status: DC | PRN
  Filled 2013-05-30: qty 30

## 2013-05-30 MED ORDER — TERBUTALINE SULFATE 1 MG/ML IJ SOLN: 0.2500 mg | Freq: Once | INTRAMUSCULAR | Status: AC | PRN

## 2013-05-30 MED ORDER — DIPHENHYDRAMINE HCL 50 MG/ML IJ SOLN: 12.5000 mg | INTRAMUSCULAR | Status: DC | PRN

## 2013-05-30 MED ORDER — FENTANYL 2.5 MCG/ML BUPIVACAINE 1/10 % EPIDURAL INFUSION (WH - ANES)
14.0000 mL/h | INTRAMUSCULAR | Status: DC | PRN
  Administered 2013-05-31 (×2): 14 mL/h via EPIDURAL
  Filled 2013-05-30 (×2): qty 125

## 2013-05-30 MED ORDER — OXYTOCIN 40 UNITS IN LACTATED RINGERS INFUSION - SIMPLE MED
1.0000 m[IU]/min | INTRAVENOUS | Status: DC
  Administered 2013-05-30: 1 m[IU]/min via INTRAVENOUS
  Filled 2013-05-30: qty 1000

## 2013-05-30 MED ORDER — EPHEDRINE 5 MG/ML INJ
10.0000 mg | INTRAVENOUS | Status: DC | PRN
  Filled 2013-05-30: qty 2

## 2013-05-30 MED ORDER — OXYCODONE-ACETAMINOPHEN 5-325 MG PO TABS: 1.0000 | ORAL_TABLET | ORAL | Status: DC | PRN

## 2013-05-30 MED ORDER — FLEET ENEMA 7-19 GM/118ML RE ENEM: 1.0000 | ENEMA | RECTAL | Status: DC | PRN

## 2013-05-30 MED ORDER — BUTORPHANOL TARTRATE 1 MG/ML IJ SOLN
1.0000 mg | INTRAMUSCULAR | Status: DC | PRN
  Administered 2013-05-31: 1 mg via INTRAVENOUS
  Filled 2013-05-30: qty 1

## 2013-05-30 MED ORDER — OXYTOCIN BOLUS FROM INFUSION
500.0000 mL | INTRAVENOUS | Status: DC
  Administered 2013-06-01: 500 mL via INTRAVENOUS

## 2013-05-30 MED ORDER — PHENYLEPHRINE 40 MCG/ML (10ML) SYRINGE FOR IV PUSH (FOR BLOOD PRESSURE SUPPORT)
80.0000 ug | PREFILLED_SYRINGE | INTRAVENOUS | Status: DC | PRN
  Filled 2013-05-30: qty 2
  Filled 2013-05-30: qty 10

## 2013-05-30 MED ORDER — LABETALOL HCL 5 MG/ML IV SOLN: 10.0000 mg | INTRAVENOUS | Status: DC | PRN

## 2013-05-30 MED ORDER — LACTATED RINGERS IV SOLN: 500.0000 mL | Freq: Once | INTRAVENOUS | Status: DC

## 2013-05-30 MED ORDER — IBUPROFEN 600 MG PO TABS: 600.0000 mg | ORAL_TABLET | Freq: Four times a day (QID) | ORAL | Status: DC | PRN

## 2013-05-30 MED ORDER — ACETAMINOPHEN 325 MG PO TABS: 650.0000 mg | ORAL_TABLET | ORAL | Status: DC | PRN

## 2013-05-30 MED ORDER — LACTATED RINGERS IV SOLN
500.0000 mL | INTRAVENOUS | Status: DC | PRN
  Administered 2013-05-31: 500 mL via INTRAVENOUS

## 2013-05-30 MED ORDER — OXYTOCIN 40 UNITS IN LACTATED RINGERS INFUSION - SIMPLE MED: 62.5000 mL/h | INTRAVENOUS | Status: DC

## 2013-05-30 MED ORDER — LACTATED RINGERS IV SOLN
INTRAVENOUS | Status: DC
  Administered 2013-05-30 – 2013-05-31 (×5): via INTRAVENOUS

## 2013-05-30 NOTE — MAU Note (Signed)
Patient states she was seen in the office today and sent to MAU for evaluation of elevated blood pressure. Patient states she has had a headache and dizziness for 2 days. States she saw floater this am. Denies leaking or bleeding. Reports irregular contractions all day. Reports good fetal movement.

## 2013-05-30 NOTE — H&P (Signed)
[name] is a 22 y.o. female, G2P0010 at 38 1/7 weeks, presenting for induction due to gestational hypertension--seen in office 05/29/13 and 05/30/13 with elevated BPs (138/90 and 142/90).  Has "just been feeling bad" this week, with mild HA.  Denied visual sx or epigastric pain.  Reports +FM, minimal contractions.  Patient Active Problem List   Diagnosis Date Noted  . Gestational hypertension 05/30/2013  . Unspecified vitamin D deficiency 05/30/2013  . Severe obesity (BMI >= 40) 02/08/2013  . 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 UTI (urinary tract infection) in pregnancy 02/08/2013  . Back pain complicating pregnancy 02/08/2013    History of present pregnancy: Patient entered care at 9 6/7 weeks.   EDC of 06/05/13 was established by LMP and was in agreement with [place] in 1st trimester  Anatomy scan:  18 3/7 weeks, with normal findings and an posterior placenta.   Additional [place] evaluations:   32 2/7 weeks, for S<D:  1834 gm, 25%ile, AFI 14.46, 50%ile. 36 2/7 weeks, for S<D:  6+4, 73.4%ile, AFI 50%ile, vtx. 39 weeks:  3531 gm, 77%ile, AFI 12.1, vtx. Significant prenatal events:  Tx for UTI at 16 weeks.  Early glucola WNL, repeat glucola elevated with normal 3 hour GTT.  Started on Fe for mild anemia.  TDaP at 33 weeks.  PIH evaluation on 05/29/13 due to swelling and elevated BP, with normal labs.   Last evaluation:  05/30/13, with elevated BP.  Sent to MAU for further w/u.  History OB History   Grav Para Term Preterm Abortions TAB SAB Ect Mult Living   2    1 1     0    2011--TAB 1st trimester  Past Medical History  Diagnosis Date  . Anemia    Past Surgical History  Procedure Laterality Date  . Cholecystectomy    . Elbow fracture surgery      BROKEN HUMEROUS  . Wisdom tooth extraction     Family History: family history includes Anemia in her father; Arthritis in her father and mother; Diabetes in her maternal grandmother; Heart disease in her maternal grandmother and paternal  grandmother; Hypertension in her father and mother.  Social History:  reports that she has quit smoking. Her smoking use included Cigarettes. She has a .2 pack-year smoking history. She has never used smokeless tobacco. She reports that she does not drink alcohol or use illicit drugs.  FOB is not currently present with her.  Patient is accompanied by her mother.  Patient is employed in Personnel officer at [employer].   Prenatal Transfer Tool  Maternal Diabetes: No Genetic Screening: Normal Maternal Ultrasounds/Referrals: Normal Fetal Ultrasounds or other Referrals:  None Maternal Substance Abuse:  Yes:  Type: Smoker (minimal) Significant Maternal Medications:  None Significant Maternal Lab Results:  Lab values include: Group B Strep negative Other Comments:  Gestational hypertension dx 05/29/13  ROS:  Mild contractions, + FM.  Dilation: 3 Effacement (%): 70 Station: -2 Exam by:: Dr. Su Hilt Blood pressure 143/93, pulse 120, temperature 98.4 F (36.9 C), temperature source Oral, resp. rate 20, last menstrual period 07/02/2012, SpO2 99.00%.  Filed Vitals:   05/30/13 2047 05/30/13 2052 05/30/13 2101 05/30/13 2102  BP:   135/90 143/93  Pulse: 116 111 119 120  Temp:      TempSrc:      Resp:   20   SpO2: 97% 99%      Exam Physical Exam  Chest clear Heart RRR without murmur Abd gravid, NT Pelvic-- Ext DTR 2+ without  clonus, 1+ edema  FHR Category 1 UCs occasional  Prenatal labs: ABO, Rh: A/POS/-- (03/20 1453) Antibody: NEG (03/20 1453) Rubella: 5.45 (03/20 1453)--immune RPR: NON REAC (03/20 1453)  HBsAg: NEGATIVE (03/20 1453)  HIV: NON REACTIVE (03/20 1453)  GBS: Negative (10/23 0000)  Early glucola WNL Repeat glucola elevated, normal 3 hour GTT PIH labs WNL 05/29/13 and 05/30/13 Pap WNL during pregnancy  Assessment/Plan: IUP at 39 1/[redacted] weeks Gestational hypertension GBS negative  Favorable cervix  Plan: Admitted to [facility] for induction per consult with Dr.  Normand Sloop Routine [facility] orders Plan low-dose pitocin during night. Pain medication prn. Repeat labs in the am. R&B of induction reviewed with patient and mother, including need for serial induction, prolonged labor, risk of C/S--patient and mother seem to understand these risks and wish to proceed.    Nigel Bridgeman 05/30/2013, 9:09 PM

## 2013-05-30 NOTE — Progress Notes (Signed)
  Subjective: Now on Birthing Suite--comfortable.  Had light meal.  Objective: BP 132/97  Pulse 112  Temp(Src) 98.4 F (36.9 C) (Oral)  Resp 18  Ht 5\' 4"  (1.626 m)  Wt 267 lb (121.11 kg)  BMI 45.81 kg/m2  SpO2 99%  LMP 07/02/2012  Filed Vitals:   05/30/13 2153 05/30/13 2158 05/30/13 2201 05/30/13 2203  BP:   132/97   Pulse: 110 107 109 112  Temp:      TempSrc:      Resp:   18   Height:      Weight:      SpO2: 100% 99%  99%         JYN:[id number] 1 UC:   Occasional, mild. SVE:   Dilation: 3 Effacement (%): 70 Station: -2;-1 Exam by:: Manfred Arch, CNM Cervix very posterior and firm.  Assessment / Plan: Induction of labor for gestational hypertension Will start low-dose pitocin and run during night for support for cervical softening. Labetalol IV prn for elevated BPs.  Nigel Bridgeman 05/30/2013, 10:31 PM

## 2013-05-30 NOTE — MAU Note (Signed)
Pt states her BP started becoming elevated about 3 days ago and was seen in her OB's office today and yesterday. Pt states she had labs drawn on Monday .Pt states she is just not feeling well and after being seen in the office today,Pt states she was sent over from office for further evatuation.

## 2013-05-31 ENCOUNTER — Inpatient Hospital Stay (HOSPITAL_COMMUNITY): Payer: BC Managed Care – PPO | Admitting: Anesthesiology

## 2013-05-31 ENCOUNTER — Encounter (HOSPITAL_COMMUNITY): Payer: BC Managed Care – PPO | Admitting: Anesthesiology

## 2013-05-31 LAB — CBC
HCT: 37.9 % (ref 36.0–46.0)
HCT: 39.2 % (ref 36.0–46.0)
Hemoglobin: 12.7 g/dL (ref 12.0–15.0)
Hemoglobin: 13.1 g/dL (ref 12.0–15.0)
MCH: 25.5 pg — ABNORMAL LOW (ref 26.0–34.0)
MCH: 25.6 pg — ABNORMAL LOW (ref 26.0–34.0)
MCHC: 33.5 g/dL (ref 30.0–36.0)
MCHC: 33.4 g/dL (ref 30.0–36.0)
MCV: 76.1 fL — ABNORMAL LOW (ref 78.0–100.0)
MCV: 76.7 fL — ABNORMAL LOW (ref 78.0–100.0)
Platelets: 279 K/uL (ref 150–400)
Platelets: 276 K/uL (ref 150–400)
RBC: 4.98 MIL/uL (ref 3.87–5.11)
RBC: 5.11 MIL/uL (ref 3.87–5.11)
RDW: 14.7 % (ref 11.5–15.5)
RDW: 14.7 % (ref 11.5–15.5)
WBC: 9.2 10*3/uL (ref 4.0–10.5)
WBC: 10 K/uL (ref 4.0–10.5)

## 2013-05-31 LAB — COMPREHENSIVE METABOLIC PANEL
ALT: 13 U/L (ref 0–35)
Albumin: 2.5 g/dL — ABNORMAL LOW (ref 3.5–5.2)
Alkaline Phosphatase: 203 U/L — ABNORMAL HIGH (ref 39–117)
BUN: 7 mg/dL (ref 6–23)
Chloride: 107 mEq/L (ref 96–112)
GFR calc Af Amer: >90 mL/min (ref >90)
GFR calc non Af Amer: >90 mL/min (ref >90)
Glucose, Bld: 123 mg/dL — ABNORMAL HIGH (ref 70–99)
Potassium: 4.2 mEq/L (ref 3.5–5.1)
Sodium: 137 mEq/L (ref 135–145)
Total Bilirubin: 0.3 mg/dL (ref 0.3–1.2)
Total Protein: 6.6 g/dL (ref 6.0–8.3)

## 2013-05-31 LAB — URIC ACID: Uric Acid, Serum: 7.2 mg/dL — ABNORMAL HIGH (ref 2.4–7.0)

## 2013-05-31 LAB — COMPREHENSIVE METABOLIC PANEL WITH GFR
AST: 13 U/L (ref 0–37)
CO2: 16 meq/L — ABNORMAL LOW (ref 19–32)
Calcium: 9.1 mg/dL (ref 8.4–10.5)
Creatinine, Ser: 0.68 mg/dL (ref 0.50–1.10)

## 2013-05-31 LAB — LACTATE DEHYDROGENASE: LDH: 150 U/L (ref 94–250)

## 2013-05-31 LAB — RPR: RPR Ser Ql: NONREACTIVE

## 2013-05-31 MED ORDER — SODIUM BICARBONATE 8.4 % IV SOLN
INTRAVENOUS | Status: DC | PRN
  Administered 2013-05-31: 5 mL via EPIDURAL

## 2013-05-31 MED ORDER — BUPIVACAINE HCL (PF) 0.25 % IJ SOLN
INTRAMUSCULAR | Status: DC | PRN
  Administered 2013-05-31: 5 mL via EPIDURAL
  Administered 2013-05-31: 3 mL via EPIDURAL

## 2013-05-31 MED ORDER — OXYTOCIN 40 UNITS IN LACTATED RINGERS INFUSION - SIMPLE MED: 1.0000 m[IU]/min | INTRAVENOUS | Status: DC

## 2013-05-31 NOTE — Progress Notes (Signed)
22 y.o. year old female,at [redacted]w[redacted]d gestation.  SUBJECTIVE:  Contractions are uncomfortable.  OBJECTIVE:  BP 144/53  Pulse 115  Temp(Src) 98.4 F (36.9 C) (Oral)  Resp 18  Ht 5\' 4"  (1.626 m)  Wt 267 lb (121.11 kg)  BMI 45.81 kg/m2  SpO2 99%  LMP 07/02/2012  Fetal Heart Tones:  Now category 1  Contractions:          [place] units are less than 50  Cervix is 5 cm dilated based on the nurse's exam  ASSESSMENT:  [redacted]w[redacted]d Weeks Pregnancy  Pregnancy-induced hypertension  Slow induction of labor  PLAN:  Baseline fetal heart tones were noted at 110 beats per minute earlier. The tracing was thought to be a category 2. An amnioinfusion was performed. I reviewed the tracing with Dr. Olean Ree, MFM. I believe that we can continue with induction of labor with the hope of having a vaginal delivery.   Leonard Schwartz, M.D.

## 2013-05-31 NOTE — Anesthesia Preprocedure Evaluation (Signed)
Anesthesia Evaluation  Patient identified by MRN, date of birth, ID band Patient awake    Reviewed: Allergy & Precautions, H&P , Patient's Chart, lab work & pertinent test results  Airway Mallampati: II  TM Distance: >3 FB Neck ROM: full    Dental  (+) Teeth Intact   Pulmonary former smoker,    breath sounds clear to auscultation       Cardiovascular  Rhythm:regular Rate:Normal     Neuro/Psych    GI/Hepatic   Endo/Other  GestationalMorbid obesity  Renal/GU      Musculoskeletal   Abdominal   Peds  Hematology   Anesthesia Other Findings       Reproductive/Obstetrics (+) Pregnancy                             Anesthesia Physical Anesthesia Plan  ASA: III  Anesthesia Plan: Epidural   Post-op Pain Management:    Induction:   Airway Management Planned:   Additional Equipment:   Intra-op Plan:   Post-operative Plan:   Informed Consent: I have reviewed the patients History and Physical, chart, labs and discussed the procedure including the risks, benefits and alternatives for the proposed anesthesia with the patient or authorized representative who has indicated his/her understanding and acceptance.   Dental Advisory Given  Plan Discussed with:   Anesthesia Plan Comments: (Labs checked- platelets confirmed with RN in room. Fetal heart tracing, per RN, reported to be stable enough for sitting procedure. Discussed epidural, and patient consents to the procedure:  included risk of possible headache,backache, failed block, allergic reaction, and nerve injury. This patient was asked if she had any questions or concerns before the procedure started.)        Anesthesia Quick Evaluation  

## 2013-05-31 NOTE — Progress Notes (Signed)
  Subjective: Sleeping soundly.  Objective: BP 133/71  Pulse 93  Temp(Src) 98.5 F (36.9 C) (Oral)  Resp 18  Ht 5\' 4"  (1.626 m)  Wt 267 lb (121.11 kg)  BMI 45.81 kg/m2  SpO2 98%  LMP 07/02/2012  Filed Vitals:   05/31/13 0531 05/31/13 0532 05/31/13 0536 05/31/13 0541  BP:  133/71    Pulse: 95 93 93 93  Temp:      TempSrc:      Resp:  18    Height:      Weight:      SpO2: 99%  99% 98%  BP 161/100 at 5am, 134/89 at 5:15am.       FHT:  Category 1 overall--some sleep cycles s/p Ambien.  Baseline 115-125 with sleep cycle, 120-130 with activity. UC:   Irregular, mild SVE:   Deferred  Assessment / Plan: Induction of labor--gestational hypertension PIH labs to be repeated at 7am Will begin increasing pitocin at 7am per low-dose protocol.  Zhaire Locker 05/31/2013, 6:08 AM

## 2013-05-31 NOTE — Anesthesia Procedure Notes (Signed)
Epidural Patient location during procedure: OB  Preanesthetic Checklist Completed: patient identified, site marked, surgical consent, pre-op evaluation, timeout performed, IV checked, risks and benefits discussed and monitors and equipment checked  Epidural Patient position: sitting Prep: site prepped and draped and DuraPrep Patient monitoring: continuous pulse ox and blood pressure Approach: midline Injection technique: LOR air  Needle:  Needle type: Tuohy  Needle gauge: 17 G Needle length: 9 cm and 9 Needle insertion depth: 8 cm Catheter type: closed end flexible Catheter size: 19 Gauge Catheter at skin depth: 16 cm Test dose: negative  Assessment Events: blood not aspirated, injection not painful, no injection resistance, negative IV test and no paresthesia  Additional Notes Dosing of Epidural:  1st dose, through catheter ............................................. epi 1:200K + Xylocaine 40 mg  2nd dose, through catheter, after waiting 3 minutes.....epi 1:200K + Xylocaine 60 mg    ( 2% Xylo charted as a single dose in Epic Meds for ease of charting; actual dosing was fractionated as above, for saftey's sake)  As each dose occurred, patient was free of IV sx; and patient exhibited no evidence of SA injection.  Patient is more comfortable after epidural dosed. Please see RN's note for documentation of vital signs,and FHR which are stable.  Patient reminded not to try to ambulate with numb legs, and that an RN must be present when she attempts to get up.       

## 2013-05-31 NOTE — Progress Notes (Addendum)
  Subjective: Sleeping soundly.  Objective: BP 133/66  Pulse 103  Temp(Src) 98 F (36.7 C) (Oral)  Resp 18  Ht 5\' 4"  (1.626 m)  Wt 267 lb (121.11 kg)  BMI 45.81 kg/m2  SpO2 98%  LMP 07/02/2012   Filed Vitals:   05/31/13 0053 05/31/13 0113 05/31/13 0132 05/31/13 0202  BP:  138/73 140/89 133/66  Pulse: 114 108 105 103  Temp:      TempSrc:      Resp:  18 18 18   Height:      Weight:      SpO2: 98%          FHT:  Category 1 UC:   Irregular, mild SVE:  Deferred Pitocin on 4 mu/min  Assessment / Plan: Induction for gestational hypertension. Low-dose pitocin for cervical ripening. Repeat PIH labs at 7am (patient plans epidural as labor advances).  [name] 05/31/2013, 2:33 AM

## 2013-05-31 NOTE — Progress Notes (Signed)
SUBJECTIVE:  No headaches, blurred vision, or right upper quadrant tenderness.  OBJECTIVE:  BP 153/93  Pulse 92  Temp(Src) 97.6 F (36.4 C) (Axillary)  Resp 18  Ht 5\' 4"  (1.626 m)  Wt 267 lb (121.11 kg)  BMI 45.81 kg/m2  SpO2 100%  LMP 07/02/2012   Fetal Heart Tones:  Category 1. Short-term variability has decreased however.  Contractions:          Mild  The cervix is 3 cm dilated, 60% effaced, and -3 in station. Artificial rupture membranes was performed. The amniotic fluid is clear. Intrauterine pressure catheter was placed. Accelerations were noted with scalp stimulation.  ASSESSMENT:  [redacted]w[redacted]d Weeks Pregnancy  Increased blood pressure  Not in active labor as of yet  PLAN:  Continue to increase Pitocin as needed. We will perform an amnioinfusion if necessary.  Leonard Schwartz, M.D.

## 2013-05-31 NOTE — Progress Notes (Signed)
  Subjective: Pt is c/o lower abdominal and rectal pressure.  Pt noted reduction in lower abdominal pressure when IUPC was replaced.  Pt able to sleep between visits.  Objective: BP 139/45  Pulse 114  Temp(Src) 97.8 F (36.6 C) (Oral)  Resp 20  Ht 5\' 4"  (1.626 m)  Wt 121.11 kg (267 lb)  BMI 45.81 kg/m2  SpO2 99%  LMP 07/02/2012      FHT:  Cat II - replaced IUPC, bolus and position change UC:   regular, every 3 minutes  SVE:   Dilation: 5 Effacement (%): 60 Station: -2 Exam by:: Annamary Rummage, CNM  Assessment / Plan:  IUPC replaced   Labor: IOL for PIH; Pit at 18 miliU; MVUs not able to calculate at this time Preeclampsia: 114/64 - 153/93 (0700 - 2026); no s/s Fetal Wellbeing: Cat II Pain Control: Epidural I/D: AROM at 1030 (11/13); GBS neg; Afebrile Anticipated MOD: SVD   Vaidehi Braddy 05/31/2013, 8:24 PM

## 2013-06-01 ENCOUNTER — Encounter (HOSPITAL_COMMUNITY): Payer: Self-pay

## 2013-06-01 LAB — COMPREHENSIVE METABOLIC PANEL WITH GFR
Albumin: 2 g/dL — ABNORMAL LOW (ref 3.5–5.2)
Chloride: 106 meq/L (ref 96–112)
Creatinine, Ser: 0.58 mg/dL (ref 0.50–1.10)
Potassium: 3.4 meq/L — ABNORMAL LOW (ref 3.5–5.1)
Total Bilirubin: 0.4 mg/dL (ref 0.3–1.2)

## 2013-06-01 LAB — COMPREHENSIVE METABOLIC PANEL
ALT: 12 U/L (ref 0–35)
AST: 16 U/L (ref 0–37)
Alkaline Phosphatase: 169 U/L — ABNORMAL HIGH (ref 39–117)
BUN: 7 mg/dL (ref 6–23)
CO2: 17 mEq/L — ABNORMAL LOW (ref 19–32)
Calcium: 9.4 mg/dL (ref 8.4–10.5)
GFR calc Af Amer: >90 mL/min (ref >90)
GFR calc non Af Amer: >90 mL/min (ref >90)
Glucose, Bld: 147 mg/dL — ABNORMAL HIGH (ref 70–99)
Sodium: 134 mEq/L — ABNORMAL LOW (ref 135–145)
Total Protein: 5.2 g/dL — ABNORMAL LOW (ref 6.0–8.3)

## 2013-06-01 LAB — CBC
HCT: 33.8 % — ABNORMAL LOW (ref 36.0–46.0)
Hemoglobin: 11.2 g/dL — ABNORMAL LOW (ref 12.0–15.0)
MCH: 25.3 pg — ABNORMAL LOW (ref 26.0–34.0)
MCHC: 33.1 g/dL (ref 30.0–36.0)
MCV: 76.3 fL — ABNORMAL LOW (ref 78.0–100.0)
Platelets: 252 K/uL (ref 150–400)
RBC: 4.43 MIL/uL (ref 3.87–5.11)
RDW: 14.6 % (ref 11.5–15.5)
WBC: 15.5 K/uL — ABNORMAL HIGH (ref 4.0–10.5)

## 2013-06-01 LAB — PROTEIN / CREATININE RATIO, URINE
Creatinine, Urine: 76.89 mg/dL
Protein Creatinine Ratio: 0.31 — ABNORMAL HIGH (ref 0.00–0.15)
Total Protein, Urine: 23.6 mg/dL

## 2013-06-01 LAB — LACTATE DEHYDROGENASE: LDH: 164 U/L (ref 94–250)

## 2013-06-01 LAB — GLUCOSE, CAPILLARY: Glucose-Capillary: 49 mg/dL — ABNORMAL LOW (ref 70–99)

## 2013-06-01 LAB — URIC ACID: Uric Acid, Serum: 7 mg/dL (ref 2.4–7.0)

## 2013-06-01 MED ORDER — ONDANSETRON HCL 4 MG PO TABS: 4.0000 mg | ORAL_TABLET | ORAL | Status: DC | PRN

## 2013-06-01 MED ORDER — WITCH HAZEL-GLYCERIN EX PADS: 1.0000 "application " | MEDICATED_PAD | CUTANEOUS | Status: DC | PRN

## 2013-06-01 MED ORDER — ONDANSETRON HCL 4 MG/2ML IJ SOLN: 4.0000 mg | INTRAMUSCULAR | Status: DC | PRN

## 2013-06-01 MED ORDER — LANOLIN HYDROUS EX OINT: TOPICAL_OINTMENT | CUTANEOUS | Status: DC | PRN

## 2013-06-01 MED ORDER — ZOLPIDEM TARTRATE 5 MG PO TABS: 5.0000 mg | ORAL_TABLET | Freq: Every evening | ORAL | Status: DC | PRN

## 2013-06-01 MED ORDER — PRENATAL MULTIVITAMIN CH
1.0000 | ORAL_TABLET | Freq: Every day | ORAL | Status: DC
  Administered 2013-06-01 – 2013-06-03 (×3): 1 via ORAL
  Filled 2013-06-01 (×3): qty 1

## 2013-06-01 MED ORDER — DIPHENHYDRAMINE HCL 25 MG PO CAPS: 25.0000 mg | ORAL_CAPSULE | Freq: Four times a day (QID) | ORAL | Status: DC | PRN

## 2013-06-01 MED ORDER — DIBUCAINE 1 % RE OINT: 1.0000 "application " | TOPICAL_OINTMENT | RECTAL | Status: DC | PRN

## 2013-06-01 MED ORDER — SENNOSIDES-DOCUSATE SODIUM 8.6-50 MG PO TABS
2.0000 | ORAL_TABLET | ORAL | Status: DC
  Administered 2013-06-01: 2 via ORAL
  Filled 2013-06-01: qty 1
  Filled 2013-06-01 (×2): qty 2

## 2013-06-01 MED ORDER — TETANUS-DIPHTH-ACELL PERTUSSIS 5-2.5-18.5 LF-MCG/0.5 IM SUSP: 0.5000 mL | Freq: Once | INTRAMUSCULAR | Status: DC

## 2013-06-01 MED ORDER — IBUPROFEN 600 MG PO TABS
600.0000 mg | ORAL_TABLET | Freq: Four times a day (QID) | ORAL | Status: DC
  Administered 2013-06-01 – 2013-06-03 (×9): 600 mg via ORAL
  Filled 2013-06-01 (×9): qty 1

## 2013-06-01 MED ORDER — SIMETHICONE 80 MG PO CHEW: 80.0000 mg | CHEWABLE_TABLET | ORAL | Status: DC | PRN

## 2013-06-01 MED ORDER — OXYCODONE-ACETAMINOPHEN 5-325 MG PO TABS
1.0000 | ORAL_TABLET | ORAL | Status: DC | PRN
  Administered 2013-06-01 – 2013-06-02 (×3): 1 via ORAL
  Filled 2013-06-01 (×3): qty 1

## 2013-06-01 MED ORDER — BENZOCAINE-MENTHOL 20-0.5 % EX AERO: 1.0000 "application " | INHALATION_SPRAY | CUTANEOUS | Status: DC | PRN

## 2013-06-01 MED ORDER — LABETALOL HCL 5 MG/ML IV SOLN
10.0000 mg | INTRAVENOUS | Status: DC | PRN
  Filled 2013-06-01: qty 4

## 2013-06-01 NOTE — Progress Notes (Signed)
Post Partum Day 0 Subjective: no complaints, up ad lib, voiding and tolerating PO  Objective: Blood pressure 107/81, pulse 117, temperature 98.1 F (36.7 C), temperature source Oral, resp. rate 18, height 5\' 4"  (1.626 m), weight 267 lb (121.11 kg), last menstrual period 07/02/2012, SpO2 95.00%, unknown if currently breastfeeding.  Physical Exam:  General: alert, cooperative, no distress and moderately obese Lochia: appropriate Uterine Fundus: firm Incision: na DVT Evaluation: No evidence of DVT seen on physical exam.   Recent Labs  05/31/13 1100 06/01/13 0545  HGB 13.1 11.2*  HCT 39.2 33.8*    Assessment/Plan: Breastfeeding and Circumcision prior to discharge Risks and benefits of circumcision reviewed with the parents and all of their questions were answered   LOS: 2 days   HAYGOOD,VANESSA P 06/01/2013, 3:43 PM

## 2013-06-01 NOTE — Lactation Note (Signed)
This note was copied from the chart of Boy Mazelle Huebert. Lactation Consultation Note Initial consultation at 1040, mom states she just fed baby at 0930 (this was later found to be error), and baby is sound asleep in bassinet.  Mom states she wants to exclusively breast feed. Reviewed br feeding basics with mom and dad, reviewed baby and me book breast feeding basics, reviewed lactation brochure, community resources, and BFSG, answered questions. Will f/u for latch assist after baby's next CBG. Follow up at 1200: Baby now 10 hours old. Mom attempting to latch baby football on right with nipple shield (provided by RN). Mom states baby feeds much better with nipple shield. Baby rooting, but did not maintain a rhythmic suck. Enc mom to continue frequent STS and cue based feeding, and to call for assistance if needed.   Patient Name: Boy Blessen Kimbrough [id number] Date: 06/01/2013 Reason for consult: Initial assessment   Maternal Data Formula Feeding for Exclusion: No Infant to breast within first hour of birth: Yes Has patient been taught Hand Expression?: Yes Does the patient have breastfeeding experience prior to this delivery?: No  Feeding Feeding Type: Breast Fed  LATCH Score/Interventions Latch: Repeated attempts needed to sustain latch, nipple held in mouth throughout feeding, stimulation needed to elicit sucking reflex.  Audible Swallowing: None  Type of Nipple: Everted at rest and after stimulation  Comfort (Breast/Nipple): Soft / non-tender     Hold (Positioning): Assistance needed to correctly position infant at breast and maintain latch.  LATCH Score: 6  Lactation Tools Discussed/Used Tools: Nipple Shields Nipple shield size: 24   Consult Status Consult Status: Follow-up Follow-up type: In-patient    Octavio Manns [facility] 06/01/2013, 12:21 PM

## 2013-06-01 NOTE — Anesthesia Postprocedure Evaluation (Signed)
Anesthesia Post Note  Patient: [name]  Procedure(s) Performed: * No procedures listed *  Anesthesia type: Epidural  Patient location: Mother/Baby  Post pain: Pain level controlled  Post assessment: Post-op Vital signs reviewed  Last Vitals:  Filed Vitals:   06/01/13 0805  BP: 107/81  Pulse: 117  Temp: 36.7 C  Resp: 18    Post vital signs: Reviewed  Level of consciousness:alert  Complications: No apparent anesthesia complications

## 2013-06-02 LAB — CBC
HCT: 30.7 % — ABNORMAL LOW (ref 36.0–46.0)
Hemoglobin: 10.2 g/dL — ABNORMAL LOW (ref 12.0–15.0)
MCH: 25.4 pg — ABNORMAL LOW (ref 26.0–34.0)
MCHC: 33.2 g/dL (ref 30.0–36.0)
MCV: 76.4 fL — ABNORMAL LOW (ref 78.0–100.0)
Platelets: 236 K/uL (ref 150–400)
RBC: 4.02 MIL/uL (ref 3.87–5.11)
RDW: 14.9 % (ref 11.5–15.5)
WBC: 15.3 10*3/uL — ABNORMAL HIGH (ref 4.0–10.5)

## 2013-06-02 NOTE — Progress Notes (Signed)
Patient ID: [name], female   DOB: April 05, 1991, 22 y.o.   MRN: [id number] Post Partum Day 1  Subjective: no complaints, up ad lib without syncope, voiding, tolerating PO,  Pain well controlled with po meds,  Working on BF, supp w formula due to low blood sugar in infant  Mood stable, bonding well Contraception: unsure but considering IUD    Objective: Blood pressure 125/73, pulse 98, temperature 98.1 F (36.7 C), temperature source Oral, resp. rate 20, height 5\' 4"  (1.626 m), weight 267 lb (121.11 kg), last menstrual period 07/02/2012, SpO2 99.00%, unknown if currently breastfeeding.  Physical Exam:  General: NAD  Lochia: appropriate Uterine Fundus: firm Perineum: healing  DVT Evaluation: No evidence of DVT seen on physical exam. Negative Homan's sign. No significant calf/ankle edema.   Recent Labs  06/01/13 0545 06/02/13 0901  HGB 11.2* 10.2*  HCT 33.8* 30.7*    Assessment/Plan: Plan for discharge tomorrow, Breastfeeding, Lactation consult, Circumcision prior to discharge and Contraception to decide Gest HTN, BP stable now after delivery       LOS: 3 days   Lasalle Abee M 06/02/2013, 10:18 AM

## 2013-06-03 MED ORDER — IBUPROFEN 600 MG PO TABS: 600.0000 mg | ORAL_TABLET | Freq: Four times a day (QID) | ORAL | Status: DC

## 2013-06-03 MED ORDER — OXYCODONE-ACETAMINOPHEN 5-325 MG PO TABS: 1.0000 | ORAL_TABLET | ORAL | Status: DC | PRN

## 2013-06-03 NOTE — Discharge Instructions (Signed)
 Postpartum Care After Vaginal Delivery After you deliver your newborn (postpartum period), the usual stay in the hospital is 24 72 hours. If there were problems with your labor or delivery, or if you have other medical problems, you might be in the hospital longer.  While you are in the hospital, you will receive help and instructions on how to care for yourself and your newborn during the postpartum period.  While you are in the hospital:  Be sure to tell your nurses if you have pain or discomfort, as well as where you feel the pain and what makes the pain worse.  If you had an incision made near your vagina (episiotomy) or if you had some tearing during delivery, the nurses may put ice packs on your episiotomy or tear. The ice packs may help to reduce the pain and swelling.  If you are breastfeeding, you may feel uncomfortable contractions of your uterus for a couple of weeks. This is normal. The contractions help your uterus get back to normal size.  It is normal to have some bleeding after delivery.  For the first 1 3 days after delivery, the flow is red and the amount may be similar to a period.  It is common for the flow to start and stop.  In the first few days, you may pass some small clots. Let your nurses know if you begin to pass large clots or your flow increases.  Do not  flush blood clots down the toilet before having the nurse look at them.  During the next 3 10 days after delivery, your flow should become more watery and pink or brown-tinged in color.  Ten to fourteen days after delivery, your flow should be a small amount of yellowish-white discharge.  The amount of your flow will decrease over the first few weeks after delivery. Your flow may stop in 6 8 weeks. Most women have had their flow stop by 12 weeks after delivery.  You should change your sanitary pads frequently.  Wash your hands thoroughly with soap and water for at least 20 seconds after changing pads, using  the toilet, or before holding or feeding your newborn.  You should feel like you need to empty your bladder within the first 6 8 hours after delivery.  In case you become weak, lightheaded, or faint, call your nurse before you get out of bed for the first time and before you take a shower for the first time.  Within the first few days after delivery, your breasts may begin to feel tender and full. This is called engorgement. Breast tenderness usually goes away within 48 72 hours after engorgement occurs. You may also notice milk leaking from your breasts. If you are not breastfeeding, do not stimulate your breasts. Breast stimulation can make your breasts produce more milk.  Spending as much time as possible with your newborn is very important. During this time, you and your newborn can feel close and get to know each other. Having your newborn stay in your room (rooming in) will help to strengthen the bond with your newborn. It will give you time to get to know your newborn and become comfortable caring for your newborn.  Your hormones change after delivery. Sometimes the hormone changes can temporarily cause you to feel sad or tearful. These feelings should not last more than a few days. If these feelings last longer than that, you should talk to your caregiver.  If desired, talk to your caregiver about  methods of family planning or contraception.  Talk to your caregiver about immunizations. Your caregiver may want you to have the following immunizations before leaving the hospital:  Tetanus, diphtheria, and pertussis (Tdap) or tetanus and diphtheria (Td) immunization. It is very important that you and your family (including grandparents) or others caring for your newborn are up-to-date with the Tdap or Td immunizations. The Tdap or Td immunization can help protect your newborn from getting ill.  Rubella immunization.  Varicella (chickenpox) immunization.  Influenza immunization. You should  receive this annual immunization if you did not receive the immunization during your pregnancy. Document Released: 05/02/2007 Document Revised: 03/29/2012 Document Reviewed: 03/01/2012 [facility] Patient Information 2014 [place], [place].  Postpartum Depression and Baby Blues The postpartum period begins right after the birth of a baby. During this time, there is often a great amount of joy and excitement. It is also a time of considerable changes in the life of the parent(s). Regardless of how many times a mother gives birth, each child brings new challenges and dynamics to the family. It is not unusual to have feelings of excitement accompanied by confusing shifts in moods, emotions, and thoughts. All mothers are at risk of developing postpartum depression or the baby blues. These mood changes can occur right after giving birth, or they may occur many months after giving birth. The baby blues or postpartum depression can be mild or severe. Additionally, postpartum depression can resolve rather quickly, or it can be a long-term condition. CAUSES Elevated hormones and their rapid decline are thought to be a main cause of postpartum depression and the baby blues. There are a number of hormones that radically change during and after pregnancy. Estrogen and progesterone usually decrease immediately after delivering your baby. The level of thyroid  hormone and various cortisol steroids also rapidly drop. Other factors that play a major role in these changes include major life events and genetics.  RISK FACTORS If you have any of the following risks for the baby blues or postpartum depression, know what symptoms to watch out for during the postpartum period. Risk factors that may increase the likelihood of getting the baby blues or postpartum depression include:  Havinga personal or family history of depression.  Having depression while being pregnant.  Having premenstrual or oral contraceptive-associated  mood issues.  Having exceptional life stress.  Having marital conflict.  Lacking a social support network.  Having a baby with special needs.  Having health problems such as diabetes. SYMPTOMS Baby blues symptoms include:  Brief fluctuations in mood, such as going from extreme happiness to sadness.  Decreased concentration.  Difficulty sleeping.  Crying spells, tearfulness.  Irritability.  Anxiety. Postpartum depression symptoms typically begin within the first month after giving birth. These symptoms include:  Difficulty sleeping or excessive sleepiness.  Marked weight loss.  Agitation.  Feelings of worthlessness.  Lack of interest in activity or food. Postpartum psychosis is a very concerning condition and can be dangerous. Fortunately, it is rare. Displaying any of the following symptoms is cause for immediate medical attention. Postpartum psychosis symptoms include:  Hallucinations and delusions.  Bizarre or disorganized behavior.  Confusion or disorientation. DIAGNOSIS  A diagnosis is made by an evaluation of your symptoms. There are no medical or lab tests that lead to a diagnosis, but there are various questionnaires that a caregiver may use to identify those with the baby blues, postpartum depression, or psychosis. Often times, a screening tool called the Edinburgh Postnatal Depression Scale is used to diagnose  depression in the postpartum period.  TREATMENT The baby blues usually goes away on its own in 1 to 2 weeks. Social support is often all that is needed. You should be encouraged to get adequate sleep and rest. Occasionally, you may be given medicines to help you sleep.  Postpartum depression requires treatment as it can last several months or longer if it is not treated. Treatment may include individual or group therapy, medicine, or both to address any social, physiological, and psychological factors that may play a role in the depression. Regular  exercise, a healthy diet, rest, and social support may also be strongly recommended.  Postpartum psychosis is more serious and needs treatment right away. Hospitalization is often needed. HOME CARE INSTRUCTIONS  Get as much rest as you can. Nap when the baby sleeps.  Exercise regularly. Some women find yoga and walking to be beneficial.  Eat a balanced and nourishing diet.  Do little things that you enjoy. Have a cup of tea, take a bubble bath, read your favorite magazine, or listen to your favorite music.  Avoid alcohol.  Ask for help with household chores, cooking, grocery shopping, or running errands as needed. Do not try to do everything.  Talk to people close to you about how you are feeling. Get support from your partner, family members, friends, or other new moms.  Try to stay positive in how you think. Think about the things you are grateful for.  Do not spend a lot of time alone.  Only take medicine as directed by your caregiver.  Keep all your postpartum appointments.  Let your caregiver know if you have any concerns. SEEK MEDICAL CARE IF: You are having a reaction or problems with your medicine. SEEK IMMEDIATE MEDICAL CARE IF:  You have suicidal feelings.  You feel you may harm the baby or someone else. Document Released: 04/08/2004 Document Revised: 09/27/2011 Document Reviewed: 05/11/2011 [facility] Patient Information 2014 [place], [place].  Breastfeeding Deciding to breastfeed is one of the best choices you can make for you and your baby. A change in hormones during pregnancy causes your breast tissue to grow and increases the number and size of your milk ducts. These hormones also allow proteins, sugars, and fats from your blood supply to make breast milk in your milk-producing glands. Hormones prevent breast milk from being released before your baby is born as well as prompt milk flow after birth. Once breastfeeding has begun, thoughts of your baby, as well as his  or her sucking or crying, can stimulate the release of milk from your milk-producing glands.  BENEFITS OF BREASTFEEDING For Your Baby  Your first milk (colostrum) helps your baby's digestive system function better.   There are antibodies in your milk that help your baby fight off infections.   Your baby has a lower incidence of asthma, allergies, and sudden infant death syndrome.   The nutrients in breast milk are better for your baby than infant formulas and are designed uniquely for your baby's needs.   Breast milk improves your baby's brain development.   Your baby is less likely to develop other conditions, such as childhood obesity, asthma, or type 2 diabetes mellitus.  For You   Breastfeeding helps to create a very special bond between you and your baby.   Breastfeeding is convenient. Breast milk is always available at the correct temperature and costs nothing.   Breastfeeding helps to burn calories and helps you lose the weight gained during pregnancy.   Breastfeeding makes  your uterus contract to its prepregnancy size faster and slows bleeding (lochia) after you give birth.   Breastfeeding helps to lower your risk of developing type 2 diabetes mellitus, osteoporosis, and breast or ovarian cancer later in life. SIGNS THAT YOUR BABY IS HUNGRY Early Signs of Hunger  Increased alertness or activity.  Stretching.  Movement of the head from side to side.  Movement of the head and opening of the mouth when the corner of the mouth or cheek is stroked (rooting).  Increased sucking sounds, smacking lips, cooing, sighing, or squeaking.  Hand-to-mouth movements.  Increased sucking of fingers or hands. Late Signs of Hunger  Fussing.  Intermittent crying. Extreme Signs of Hunger Signs of extreme hunger will require calming and consoling before your baby will be able to breastfeed successfully. Do not wait for the following signs of extreme hunger to occur before  you initiate breastfeeding:   Restlessness.  A loud, strong cry.   Screaming. BREASTFEEDING BASICS Breastfeeding Initiation  Find a comfortable place to sit or lie down, with your neck and back well supported.  Place a pillow or rolled up blanket under your baby to bring him or her to the level of your breast (if you are seated). Nursing pillows are specially designed to help support your arms and your baby while you breastfeed.  Make sure that your baby's abdomen is facing your abdomen.   Gently massage your breast. With your fingertips, massage from your chest wall toward your nipple in a circular motion. This encourages milk flow. You may need to continue this action during the feeding if your milk flows slowly.  Support your breast with 4 fingers underneath and your thumb above your nipple. Make sure your fingers are well away from your nipple and your baby's mouth.   Stroke your baby's lips gently with your finger or nipple.   When your baby's mouth is open wide enough, quickly bring your baby to your breast, placing your entire nipple and as much of the colored area around your nipple (areola) as possible into your baby's mouth.   More areola should be visible above your baby's upper lip than below the lower lip.   Your baby's tongue should be between his or her lower gum and your breast.   Ensure that your baby's mouth is correctly positioned around your nipple (latched). Your baby's lips should create a seal on your breast and be turned out (everted).  It is common for your baby to suck about 2 3 minutes in order to start the flow of breast milk. Latching Teaching your baby how to latch on to your breast properly is very important. An improper latch can cause nipple pain and decreased milk supply for you and poor weight gain in your baby. Also, if your baby is not latched onto your nipple properly, he or she may swallow some air during feeding. This can make your baby  fussy. Burping your baby when you switch breasts during the feeding can help to get rid of the air. However, teaching your baby to latch on properly is still the best way to prevent fussiness from swallowing air while breastfeeding. Signs that your baby has successfully latched on to your nipple:    Silent tugging or silent sucking, without causing you pain.   Swallowing heard between every 3 4 sucks.    Muscle movement above and in front of his or her ears while sucking.  Signs that your baby has not successfully latched on  to nipple:   Sucking sounds or smacking sounds from your baby while breastfeeding.  Nipple pain. If you think your baby has not latched on correctly, slip your finger into the corner of your baby's mouth to break the suction and place it between your baby's gums. Attempt breastfeeding initiation again. Signs of Successful Breastfeeding Signs from your baby:   A gradual decrease in the number of sucks or complete cessation of sucking.   Falling asleep.   Relaxation of his or her body.   Retention of a small amount of milk in his or her mouth.   Letting go of your breast by himself or herself. Signs from you:  Breasts that have increased in firmness, weight, and size 1 3 hours after feeding.   Breasts that are softer immediately after breastfeeding.  Increased milk volume, as well as a change in milk consistency and color by the 5th day of breastfeeding.   Nipples that are not sore, cracked, or bleeding. Signs That Your Ezella is Getting Enough Milk  Wetting at least 3 diapers in a 24-hour period. The urine should be clear and pale yellow by age 39 days.  At least 3 stools in a 24-hour period by age 39 days. The stool should be soft and yellow.  At least 3 stools in a 24-hour period by age 301 days. The stool should be seedy and yellow.  No loss of weight greater than 10% of birth weight during the first 67 days of age.  Average weight gain of 4 7  ounces (120 210 mL) per week after age 30 days.  Consistent daily weight gain by age 39 days, without weight loss after the age of 2 weeks. After a feeding, your baby may spit up a small amount. This is common. BREASTFEEDING FREQUENCY AND DURATION Frequent feeding will help you make more milk and can prevent sore nipples and breast engorgement. Breastfeed when you feel the need to reduce the fullness of your breasts or when your baby shows signs of hunger. This is called breastfeeding on demand. Avoid introducing a pacifier to your baby while you are working to establish breastfeeding (the first 4 6 weeks after your baby is born). After this time you may choose to use a pacifier. Research has shown that pacifier use during the first year of a baby's life decreases the risk of sudden infant death syndrome (SIDS). Allow your baby to feed on each breast as long as he or she wants. Breastfeed until your baby is finished feeding. When your baby unlatches or falls asleep while feeding from the first breast, offer the second breast. Because newborns are often sleepy in the first few weeks of life, you may need to awaken your baby to get him or her to feed. Breastfeeding times will vary from baby to baby. However, the following rules can serve as a guide to help you ensure that your baby is properly fed:  Newborns (babies 68 weeks of age or younger) may breastfeed every 1 3 hours.  Newborns should not go longer than 3 hours during the day or 5 hours during the night without breastfeeding.  You should breastfeed your baby a minimum of 8 times in a 24-hour period until you begin to introduce solid foods to your baby at around 58 months of age. BREAST MILK PUMPING Pumping and storing breast milk allows you to ensure that your baby is exclusively fed your breast milk, even at times when you are unable to breastfeed. This  is especially important if you are going back to work while you are still breastfeeding or when  you are not able to be present during feedings. Your lactation consultant can give you guidelines on how long it is safe to store breast milk.  A breast pump is a machine that allows you to pump milk from your breast into a sterile bottle. The pumped breast milk can then be stored in a refrigerator or freezer. Some breast pumps are operated by hand, while others use electricity. Ask your lactation consultant which type will work best for you. Breast pumps can be purchased, but some hospitals and breastfeeding support groups lease breast pumps on a monthly basis. A lactation consultant can teach you how to hand express breast milk, if you prefer not to use a pump.  CARING FOR YOUR BREASTS WHILE YOU BREASTFEED Nipples can become dry, cracked, and sore while breastfeeding. The following recommendations can help keep your breasts moisturized and healthy:  Avoid using soap on your nipples.   Wear a supportive bra. Although not required, special nursing bras and tank tops are designed to allow access to your breasts for breastfeeding without taking off your entire bra or top. Avoid wearing underwire style bras or extremely tight bras.  Air dry your nipples for 3 after each feeding.   Use only cotton bra pads to absorb leaked breast milk. Leaking of breast milk between feedings is normal.   Use lanolin on your nipples after breastfeeding. Lanolin helps to maintain your skin's normal moisture barrier. If you use pure lanolin you do not need to wash it off before feeding your baby again. Pure lanolin is not toxic to your baby. You may also hand express a few drops of breast milk and gently massage that milk into your nipples and allow the milk to air dry. In the first few weeks after giving birth, some women experience extremely full breasts (engorgement). Engorgement can make your breasts feel heavy, warm, and tender to the touch. Engorgement peaks within 3 5 days after you give birth. The  following recommendations can help ease engorgement:  Completely empty your breasts while breastfeeding or pumping. You may want to start by applying warm, moist heat (in the shower or with warm water-soaked hand towels) just before feeding or pumping. This increases circulation and helps the milk flow. If your baby does not completely empty your breasts while breastfeeding, pump any extra milk after he or she is finished.  Wear a snug bra (nursing or regular) or tank top for 1 2 days to signal your body to slightly decrease milk production.  Apply ice packs to your breasts, unless this is too uncomfortable for you.  Make sure that your baby is latched on and positioned properly while breastfeeding. If engorgement persists after 48 hours of following these recommendations, contact your health care provider or a advertising copywriter. OVERALL HEALTH CARE RECOMMENDATIONS WHILE BREASTFEEDING  Eat healthy foods. Alternate between meals and snacks, eating 3 of each per day. Because what you eat affects your breast milk, some of the foods may make your baby more irritable than usual. Avoid eating these foods if you are sure that they are negatively affecting your baby.  Drink milk, fruit juice, and water to satisfy your thirst (about 10 glasses a day).   Rest often, relax, and continue to take your prenatal vitamins to prevent fatigue, stress, and anemia.  Continue breast self-awareness checks.  Avoid chewing and smoking tobacco.  Avoid alcohol and  drug use. Some medicines that may be harmful to your baby can pass through breast milk. It is important to ask your health care provider before taking any medicine, including all over-the-counter and prescription medicine as well as vitamin and herbal supplements. It is possible to become pregnant while breastfeeding. If birth control is desired, ask your health care provider about options that will be safe for your baby. SEEK MEDICAL CARE IF:   You  feel like you want to stop breastfeeding or have become frustrated with breastfeeding.  You have painful breasts or nipples.  Your nipples are cracked or bleeding.  Your breasts are red, tender, or warm.  You have a swollen area on either breast.  You have a fever or chills.  You have nausea or vomiting.  You have drainage other than breast milk from your nipples.  Your breasts do not become full before feedings by the 5th day after you give birth.  You feel sad and depressed.  Your baby is too sleepy to eat well.  Your baby is having trouble sleeping.   Your baby is wetting less than 3 diapers in a 24-hour period.  Your baby has less than 3 stools in a 24-hour period.  Your baby's skin or the white part of his or her eyes becomes yellow.   Your baby is not gaining weight by 68 days of age. SEEK IMMEDIATE MEDICAL CARE IF:   Your baby is overly tired (lethargic) and does not want to wake up and feed.  Your baby develops an unexplained fever. Document Released: 07/05/2005 Document Revised: 03/07/2013 Document Reviewed: 12/27/2012 [facility] Patient Information 2014 [place], [place].  Iron-Rich Diet An iron-rich diet contains foods that are good sources of iron. Iron is an important mineral that helps your body produce hemoglobin. Hemoglobin is a protein in red blood cells that carries oxygen to the body's tissues. Sometimes, the iron level in your blood can be low. This may be caused by:  A lack of iron in your diet.  Blood loss.  Times of growth, such as during pregnancy or during a child's growth and development. Low levels of iron can cause a decrease in the number of red blood cells. This can result in iron deficiency anemia. Iron deficiency anemia symptoms include:  Tiredness.  Weakness.  Irritability.  Increased chance of infection. Here are some recommendations for daily iron intake:  Males older than 21 years of age need 8 mg of iron per day.  Women  ages 69 to 25 need 18 mg of iron per day.  Pregnant women need 27 mg of iron per day, and women who are over 29 years of age and breastfeeding need 9 mg of iron per day.  Women over the age of 66 need 8 mg of iron per day. SOURCES OF IRON There are 2 types of iron that are found in food: heme iron and nonheme iron. Heme iron is absorbed by the body better than nonheme iron. Heme iron is found in meat, poultry, and fish. Nonheme iron is found in grains, beans, and vegetables. Heme Iron Sources Food / Iron (mg)  Chicken liver, 3 oz (85 g)/ 10 mg  Beef liver, 3 oz (85 g)/ 5.5 mg  Oysters, 3 oz (85 g)/ 8 mg  Beef, 3 oz (85 g)/ 2 to 3 mg  Shrimp, 3 oz (85 g)/ 2.8 mg  Turkey, 3 oz (85 g)/ 2 mg  Chicken, 3 oz (85 g) / 1 mg  Fish (tuna, halibut),  3 oz (85 g)/ 1 mg  Pork, 3 oz (85 g)/ 0.9 mg Nonheme Iron Sources Food / Iron (mg)  Ready-to-eat breakfast cereal, iron-fortified / 3.9 to 7 mg  Tofu,  cup / 3.4 mg  Kidney beans,  cup / 2.6 mg  Baked potato with skin / 2.7 mg  Asparagus,  cup / 2.2 mg  Avocado / 2 mg  Dried peaches,  cup / 1.6 mg  Raisins,  cup / 1.5 mg  Soy milk, 1 cup / 1.5 mg  Whole-wheat bread, 1 slice / 1.2 mg  Spinach, 1 cup / 0.8 mg  Broccoli,  cup / 0.6 mg IRON ABSORPTION Certain foods can decrease the body's absorption of iron. Try to avoid these foods and beverages while eating meals with iron-containing foods:  Coffee.  Tea.  Fiber.  Soy. Foods containing vitamin C can help increase the amount of iron your body absorbs from iron sources, especially from nonheme sources. Eat foods with vitamin C along with iron-containing foods to increase your iron absorption. Foods that are high in vitamin C include many fruits and vegetables. Some good sources are:  Fresh orange juice.  Oranges.  Strawberries.  Mangoes.  Grapefruit.  Red bell peppers.  Green bell peppers.  Broccoli.  Potatoes with skin.  Tomato juice. Document  Released: 02/16/2005 Document Revised: 09/27/2011 Document Reviewed: 12/24/2010 [facility] Patient Information 2014 [place], [place].  Levonorgestrel intrauterine device (IUD) What is this medicine? LEVONORGESTREL IUD (LEE voe nor jes trel) is a contraceptive (birth control) device. The device is placed inside the uterus by a healthcare professional. It is used to prevent pregnancy and can also be used to treat heavy bleeding that occurs during your period. Depending on the device, it can be used for 3 to 5 years. This medicine may be used for other purposes; ask your health care provider or pharmacist if you have questions. COMMON BRAND NAME(S): Mirena, Skyla What should I tell my health care provider before I take this medicine? They need to know if you have any of these conditions: -abnormal Pap smear -cancer of the breast, uterus, or cervix -diabetes -endometritis -genital or pelvic infection now or in the past -have more than one sexual partner or your partner has more than one partner -heart disease -history of an ectopic or tubal pregnancy -immune system problems -IUD in place -liver disease or tumor -problems with blood clots or take blood-thinners -use intravenous drugs -uterus of unusual shape -vaginal bleeding that has not been explained -an unusual or allergic reaction to levonorgestrel, other hormones, silicone, or polyethylene, medicines, foods, dyes, or preservatives -pregnant or trying to get pregnant -breast-feeding How should I use this medicine? This device is placed inside the uterus by a health care professional. Talk to your pediatrician regarding the use of this medicine in children. Special care may be needed. Overdosage: If you think you have taken too much of this medicine contact a poison control center or emergency room at once. NOTE: This medicine is only for you. Do not share this medicine with others. What if I miss a dose? This does not apply. What may  interact with this medicine? Do not take this medicine with any of the following medications: -amprenavir -bosentan -fosamprenavir This medicine may also interact with the following medications: -aprepitant -barbiturate medicines for inducing sleep or treating seizures -bexarotene -griseofulvin -medicines to treat seizures like carbamazepine, ethotoin, felbamate, oxcarbazepine, phenytoin, topiramate -modafinil -pioglitazone -rifabutin -rifampin -rifapentine -some medicines to treat HIV infection like atazanavir, indinavir, lopinavir,  nelfinavir, tipranavir, ritonavir -St. John's wort -warfarin This list may not describe all possible interactions. Give your health care provider a list of all the medicines, herbs, non-prescription drugs, or dietary supplements you use. Also tell them if you smoke, drink alcohol, or use illegal drugs. Some items may interact with your medicine. What should I watch for while using this medicine? Visit your doctor or health care professional for regular check ups. See your doctor if you or your partner has sexual contact with others, becomes HIV positive, or gets a sexual transmitted disease. This product does not protect you against HIV infection (AIDS) or other sexually transmitted diseases. You can check the placement of the IUD yourself by reaching up to the top of your vagina with clean fingers to feel the threads. Do not pull on the threads. It is a good habit to check placement after each menstrual period. Call your doctor right away if you feel more of the IUD than just the threads or if you cannot feel the threads at all. The IUD may come out by itself. You may become pregnant if the device comes out. If you notice that the IUD has come out use a backup birth control method like condoms and call your health care provider. Using tampons will not change the position of the IUD and are okay to use during your period. What side effects may I notice from  receiving this medicine? Side effects that you should report to your doctor or health care professional as soon as possible: -allergic reactions like skin rash, itching or hives, swelling of the face, lips, or tongue -fever, flu-like symptoms -genital sores -high blood pressure -no menstrual period for 6 weeks during use -pain, swelling, warmth in the leg -pelvic pain or tenderness -severe or sudden headache -signs of pregnancy -stomach cramping -sudden shortness of breath -trouble with balance, talking, or walking -unusual vaginal bleeding, discharge -yellowing of the eyes or skin Side effects that usually do not require medical attention (report to your doctor or health care professional if they continue or are bothersome): -acne -breast pain -change in sex drive or performance -changes in weight -cramping, dizziness, or faintness while the device is being inserted -headache -irregular menstrual bleeding within first 3 to 6 months of use -nausea This list may not describe all possible side effects. Call your doctor for medical advice about side effects. You may report side effects to FDA at 1-800-FDA-1088. Where should I keep my medicine? This does not apply. NOTE: This sheet is a summary. It may not cover all possible information. If you have questions about this medicine, talk to your doctor, pharmacist, or health care provider.  2014, Elsevier/Gold Standard. (2011-08-05 13:54:04)

## 2013-06-03 NOTE — Discharge Summary (Signed)
Vaginal Delivery Discharge Summary  [name]  DOB:    [birth year] MRN:    [id number] CSN:    [id number]  Date of admission:                  05/30/13  Date of discharge:                   06/03/13  Procedures this admission: SVD with 1st degree laceration  Date of Delivery: 06/01/13 by Haroldine Laws  Newborn Data:  Live born female  Birth Weight: 7 lb 4.4 oz (3300 g) APGAR: 4, 9  Home with mother. Circumcision Plan: Completed in hospital  History of Present Illness:  Ms. [name] is a 22 y.o. female, G2P1011, who presents at [redacted]w[redacted]d weeks gestation. The patient has been followed at the [facility]. She was admitted onset of labor. Her pregnancy has been complicated by:   Patient Active Problem List   Diagnosis Date Noted  . Gestational hypertension 05/30/2013  . Unspecified vitamin D deficiency 05/30/2013  . Smoker 05/30/2013  . Severe obesity (BMI >= 40) 02/08/2013  . 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 UTI (urinary tract infection) in pregnancy 02/08/2013  . Back pain complicating pregnancy 02/08/2013    Hospital course:  The patient was admitted for IOL for PD.   Her labor was not complicated. She proceeded to have a vaginal delivery of a healthy infant. Her delivery was complicated by shoulder dystocia, cord avulsion followed by manual removal of placenta. Her postpartum course was not complicated. HGB 10.2 - rx FE PP. She was discharged to home on postpartum day 2 doing well.  Feeding:  breast  Contraception:  IUD  Discharge hemoglobin:  Hemoglobin  Date Value Range Status  06/02/2013 10.2* 12.0 - 15.0 g/dL Final     HCT  Date Value Range Status  06/02/2013 30.7* 36.0 - 46.0 % Final    Discharge Physical Exam:   General: alert, cooperative and no distress Lochia: appropriate Uterine Fundus: firm Incision: healing well DVT Evaluation: No evidence of DVT seen on  physical exam. Negative Homan's sign.  Intrapartum Procedures: spontaneous vaginal delivery and manual removal of placenta Postpartum Procedures: none Complications-Operative and Postpartum: 1st degree perineal laceration  Discharge Diagnoses: Term Pregnancy-delivered  Discharge Information:  Activity:           pelvic rest Diet:                routine Medications: PNV, Ibuprofen, Iron and Percocet Condition:      stable Instructions:   Postpartum Care After Vaginal Delivery  After you deliver your newborn (postpartum period), the usual stay in the hospital is 24 72 hours. If there were problems with your labor or delivery, or if you have other medical problems, you might be in the hospital longer.  While you are in the hospital, you will receive help and instructions on how to care for yourself and your newborn during the postpartum period.  While you are in the hospital:  Be sure to tell your nurses if you have pain or discomfort, as well as where you feel the pain and what makes the pain worse.  If you had an incision made near your vagina (episiotomy) or if you had some tearing during delivery, the nurses may put ice packs on your episiotomy or tear. The ice packs may help to reduce the pain and swelling.  If you are breastfeeding,  you may feel uncomfortable contractions of your uterus for a couple of weeks. This is normal. The contractions help your uterus get back to normal size.  It is normal to have some bleeding after delivery.  For the first 1 3 days after delivery, the flow is red and the amount may be similar to a period.  It is common for the flow to start and stop.  In the first few days, you may pass some small clots. Let your nurses know if you begin to pass large clots or your flow increases.  Do not  flush blood clots down the toilet before having the nurse look at them.  During the next 3 10 days after delivery, your flow should become more watery and pink or  brown-tinged in color.  Ten to fourteen days after delivery, your flow should be a small amount of yellowish-white discharge.  The amount of your flow will decrease over the first few weeks after delivery. Your flow may stop in 6 8 weeks. Most women have had their flow stop by 12 weeks after delivery.  You should change your sanitary pads frequently.  Wash your hands thoroughly with soap and water for at least 20 seconds after changing pads, using the toilet, or before holding or feeding your newborn.  You should feel like you need to empty your bladder within the first 6 8 hours after delivery.  In case you become weak, lightheaded, or faint, call your nurse before you get out of bed for the first time and before you take a shower for the first time.  Within the first few days after delivery, your breasts may begin to feel tender and full. This is called engorgement. Breast tenderness usually goes away within 48 72 hours after engorgement occurs. You may also notice milk leaking from your breasts. If you are not breastfeeding, do not stimulate your breasts. Breast stimulation can make your breasts produce more milk.  Spending as much time as possible with your newborn is very important. During this time, you and your newborn can feel close and get to know each other. Having your newborn stay in your room (rooming in) will help to strengthen the bond with your newborn. It will give you time to get to know your newborn and become comfortable caring for your newborn.  Your hormones change after delivery. Sometimes the hormone changes can temporarily cause you to feel sad or tearful. These feelings should not last more than a few days. If these feelings last longer than that, you should talk to your caregiver.  If desired, talk to your caregiver about methods of family planning or contraception.  Talk to your caregiver about immunizations. Your caregiver may want you to have the following  immunizations before leaving the hospital:  Tetanus, diphtheria, and pertussis (Tdap) or tetanus and diphtheria (Td) immunization. It is very important that you and your family (including grandparents) or others caring for your newborn are up-to-date with the Tdap or Td immunizations. The Tdap or Td immunization can help protect your newborn from getting ill.  Rubella immunization.  Varicella (chickenpox) immunization.  Influenza immunization. You should receive this annual immunization if you did not receive the immunization during your pregnancy. Document Released: 05/02/2007 Document Revised: 03/29/2012 Document Reviewed: 03/01/2012 [facility] Patient Information 2014 [place], [place].   Postpartum Depression and Baby Blues  The postpartum period begins right after the birth of a baby. During this time, there is often a great amount of joy and excitement.  It is also a time of considerable changes in the life of the parent(s). Regardless of how many times a mother gives birth, each child brings new challenges and dynamics to the family. It is not unusual to have feelings of excitement accompanied by confusing shifts in moods, emotions, and thoughts. All mothers are at risk of developing postpartum depression or the "baby blues." These mood changes can occur right after giving birth, or they may occur many months after giving birth. The baby blues or postpartum depression can be mild or severe. Additionally, postpartum depression can resolve rather quickly, or it can be a long-term condition. CAUSES Elevated hormones and their rapid decline are thought to be a main cause of postpartum depression and the baby blues. There are a number of hormones that radically change during and after pregnancy. Estrogen and progesterone usually decrease immediately after delivering your baby. The level of thyroid hormone and various cortisol steroids also rapidly drop. Other factors that play a major role in these  changes include major life events and genetics.  RISK FACTORS If you have any of the following risks for the baby blues or postpartum depression, know what symptoms to watch out for during the postpartum period. Risk factors that may increase the likelihood of getting the baby blues or postpartum depression include:  Havinga personal or family history of depression.  Having depression while being pregnant.  Having premenstrual or oral contraceptive-associated mood issues.  Having exceptional life stress.  Having marital conflict.  Lacking a social support network.  Having a baby with special needs.  Having health problems such as diabetes. SYMPTOMS Baby blues symptoms include:  Brief fluctuations in mood, such as going from extreme happiness to sadness.  Decreased concentration.  Difficulty sleeping.  Crying spells, tearfulness.  Irritability.  Anxiety. Postpartum depression symptoms typically begin within the first month after giving birth. These symptoms include:  Difficulty sleeping or excessive sleepiness.  Marked weight loss.  Agitation.  Feelings of worthlessness.  Lack of interest in activity or food. Postpartum psychosis is a very concerning condition and can be dangerous. Fortunately, it is rare. Displaying any of the following symptoms is cause for immediate medical attention. Postpartum psychosis symptoms include:  Hallucinations and delusions.  Bizarre or disorganized behavior.  Confusion or disorientation. DIAGNOSIS  A diagnosis is made by an evaluation of your symptoms. There are no medical or lab tests that lead to a diagnosis, but there are various questionnaires that a caregiver may use to identify those with the baby blues, postpartum depression, or psychosis. Often times, a screening tool called the [place] Postnatal Depression Scale is used to diagnose depression in the postpartum period.  TREATMENT The baby blues usually goes away on its  own in 1 to 2 weeks. Social support is often all that is needed. You should be encouraged to get adequate sleep and rest. Occasionally, you may be given medicines to help you sleep.  Postpartum depression requires treatment as it can last several months or longer if it is not treated. Treatment may include individual or group therapy, medicine, or both to address any social, physiological, and psychological factors that may play a role in the depression. Regular exercise, a healthy diet, rest, and social support may also be strongly recommended.  Postpartum psychosis is more serious and needs treatment right away. Hospitalization is often needed. HOME CARE INSTRUCTIONS  Get as much rest as you can. Nap when the baby sleeps.  Exercise regularly. Some women find yoga and walking to  be beneficial.  Eat a balanced and nourishing diet.  Do little things that you enjoy. Have a cup of tea, take a bubble bath, read your favorite magazine, or listen to your favorite music.  Avoid alcohol.  Ask for help with household chores, cooking, grocery shopping, or running errands as needed. Do not try to do everything.  Talk to people close to you about how you are feeling. Get support from your partner, family members, friends, or other new moms.  Try to stay positive in how you think. Think about the things you are grateful for.  Do not spend a lot of time alone.  Only take medicine as directed by your caregiver.  Keep all your postpartum appointments.  Let your caregiver know if you have any concerns. SEEK MEDICAL CARE IF: You are having a reaction or problems with your medicine. SEEK IMMEDIATE MEDICAL CARE IF:  You have suicidal feelings.  You feel you may harm the baby or someone else. Document Released: 04/08/2004 Document Revised: 09/27/2011 Document Reviewed: 05/11/2011 [facility] Patient Information 2014 [place], [place].   Discharge to: home  Follow-up Information   Follow up with  [facility]. Schedule an appointment as soon as possible for a visit in 5 weeks. (Call with any questions or concerns)    Specialty:  Obstetrics and Gynecology   Contact information:   [address] [place] [address] [phone]       Haroldine Laws 06/03/2013

## 2013-06-03 NOTE — Progress Notes (Signed)
Clinical Social Work Department PSYCHOSOCIAL ASSESSMENT - MATERNAL/CHILD 06/03/2013  Patient:  [name],[name]  Account Number:  [id number]  Admit Date:  05/30/2013  Childs Name:   Kathy Lee [name] IV    Clinical Social Worker:  Jaicee Michelotti, LCSW   Date/Time:  06/03/2013 09:30 AM  Date Referred:  06/02/2013   Referral source  Central Nursery     Referred reason  Substance Abuse   Other referral source:    I:  FAMILY / HOME ENVIRONMENT Child's legal guardian:  PARENT  Guardian - Name Guardian - Age Guardian - Address  [name],[name] 22 [address]  [place], [place] [address]  [name], [name]     Other household support members/support persons Other support:    II  PSYCHOSOCIAL DATA Information Source:  Patient Interview  Financial and Community Resources Employment:   Both parents employed   Financial resources:  Private Insurance If Medicaid - County:    School / Grade:   Maternity Care Coordinator / Child Services Coordination / Early Interventions:  Cultural issues impacting care:    Boyer  STRENGTHS Strengths  Supportive family/friends  Home prepared for Child (including basic supplies)  Adequate Resources   Strength comment:    IV  RISK FACTORS AND CURRENT PROBLEMS Current Problem:       V  SOCIAL WORK ASSESSMENT Met with mother who was pleasant and receptive to social work intervention.  FOB was present and mother gave permission for this writer to speak with her in his presence.  Parents are not married and have no other dependents.  Both parents are employed.   Mother states "I use to use marijuana, and it was not a problem during pregnancy".   Mother states that she doesn't see the need for treatment.  She communicate no intent to continue using marijuana.   UDS on newborn was negative.  She denies any hx of mental illness.   Mother reports extensive family support.   No acute social concerns reported at this time.      VI SOCIAL WORK  PLAN  Type of pt/family education:   If child protective services report - county:   If child protective services report - date:   Information/referral to community resources comment:   Pediatrician:  Pediatrics   Other social work plan:   CSW will follow PRN.    Levonte Molina J, LCSW  

## 2014-05-20 ENCOUNTER — Encounter (HOSPITAL_COMMUNITY): Payer: Self-pay

## 2014-12-15 ENCOUNTER — Emergency Department (HOSPITAL_COMMUNITY)
Admission: EM | Admit: 2014-12-15 | Discharge: 2014-12-15 | Disposition: A | Discharge status: Home / Self Care | Payer: Medicaid Other | Source: Home / Self Care | Attending: Internal Medicine | Admitting: Internal Medicine

## 2014-12-15 ENCOUNTER — Encounter (HOSPITAL_COMMUNITY): Payer: Self-pay | Admitting: *Deleted

## 2014-12-15 DIAGNOSIS — H6501 Acute serous otitis media, right ear: Secondary | ICD-10-CM

## 2014-12-15 LAB — POCT RAPID STREP A: Streptococcus, Group A Screen (Direct): NEGATIVE

## 2014-12-15 MED ORDER — CEFDINIR 300 MG PO CAPS: 300.0000 mg | ORAL_CAPSULE | Freq: Two times a day (BID) | ORAL | Status: AC

## 2014-12-15 MED ORDER — TRIAMCINOLONE ACETONIDE 55 MCG/ACT NA AERO: 2.0000 | INHALATION_SPRAY | Freq: Every day | NASAL | Status: DC

## 2014-12-15 NOTE — ED Provider Notes (Signed)
CSN: [id number]     Arrival date & time 12/15/14  1616 History   None    Chief Complaint  Patient presents with  . Sore Throat   HPI  Patient presents with several days of nasal congestion/drainage, with 2 day history of sore throat, and now right greater than left earache. Little bit of cough. Swollen glands in the neck. Achiness. Little bit of headache initially, now improving. Low-grade temps. No nausea or vomiting.  Past Medical History  Diagnosis Date  . Anemia    Past Surgical History  Procedure Laterality Date  . Cholecystectomy    . Elbow fracture surgery      BROKEN HUMEROUS  . Wisdom tooth extraction     Family History  Problem Relation Age of Onset  . Heart disease Paternal Grandmother   . Heart disease Maternal Grandmother   . Diabetes Maternal Grandmother   . Arthritis Father   . Hypertension Father   . Anemia Father   . Arthritis Mother   . Hypertension Mother    History  Substance Use Topics  . Smoking status: Former Smoker -- 0.10 packs/day for 2 years    Types: Cigarettes  . Smokeless tobacco: Never Used     Comment: quit after found out preg  . Alcohol Use: No     Comment: OCCASIONAL   OB History    Gravida Para Term Preterm AB TAB SAB Ectopic Multiple Living   2 1 1  1 1    1      Review of Systems  All other systems reviewed and are negative.   Allergies  Nickel and Other  Home Medications   Prior to Admission medications   Medication Sig Start Date End Date Taking? Authorizing Provider  calcium carbonate (TUMS - DOSED IN MG ELEMENTAL CALCIUM) 500 MG chewable tablet Chew 2 tablets by mouth 3 (three) times daily as needed for heartburn.    Historical Provider, MD  cholecalciferol (VITAMIN D) 1000 UNITS tablet Take 1,000 Units by mouth daily.     Historical Provider, MD                               BP 115/77 mmHg  Pulse 102  Temp(Src) 99.9 F (37.7 C) (Oral)  Resp 18  SpO2 98%  LMP 11/22/2014 Physical Exam  Constitutional: She  is oriented to person, place, and time. No distress.  Alert, nicely groomed  HENT:  Head: Atraumatic.  Bilateral TMs quite dull, with right-sided erythema and bulging. Marked nasal congestion. Throat red. Voice sounds quite congested, with mouth breathing  Eyes:  Conjugate gaze, no eye redness/drainage  Neck: Neck supple.  Cardiovascular: Normal rate.   Pulmonary/Chest: No respiratory distress. She has no wheezes. She has no rales.  Lungs clear, symmetric breath sounds  Abdominal: She exhibits no distension.  Musculoskeletal: Normal range of motion.  No leg swelling  Neurological: She is alert and oriented to person, place, and time.  Skin: Skin is warm and dry.  No cyanosis  Nursing note and vitals reviewed.   ED Course  Procedures (including critical care time) Labs Review Labs Reviewed  POCT RAPID STREP A  strep swab negative; throat cx pending.  Imaging Review No results found.   MDM   1. Right acute serous otitis media, recurrence not specified   Rx omnicef, nasacort.  Note for work today/tomorrow.  Recheck if not improving in the next several days.  Anticipate slow  improvement over the next 7-10 days.    Eustace Moore, MD 12/15/14 [phone]

## 2014-12-15 NOTE — Discharge Instructions (Signed)
Prescription for omnicef (antibiotic) and nasacort (nasal steroid).  Note for work today/tomorrow.  Recheck if ear ache/sore throat not improving in the next week.  Otitis Media With Effusion Otitis media with effusion is the presence of fluid in the middle ear. This is a common problem in children, which often follows ear infections. It may be present for weeks or longer after the infection. Unlike an acute ear infection, otitis media with effusion refers only to fluid behind the ear drum and not infection. Children with repeated ear and sinus infections and allergy problems are the most likely to get otitis media with effusion. CAUSES  The most frequent cause of the fluid buildup is dysfunction of the eustachian tubes. These are the tubes that drain fluid in the ears to the back of the nose (nasopharynx). SYMPTOMS   The main symptom of this condition is hearing loss. As a result, you or your child may:  Listen to the TV at a loud volume.  Not respond to questions.  Ask "what" often when spoken to.  Mistake or confuse one sound or word for another.  There may be a sensation of fullness or pressure but usually not pain. DIAGNOSIS   Your health care provider will diagnose this condition by examining you or your child's ears.  Your health care provider may test the pressure in you or your child's ear with a tympanometer.  A hearing test may be conducted if the problem persists. TREATMENT   Treatment depends on the duration and the effects of the effusion.  Antibiotics, decongestants, nose drops, and cortisone-type drugs (tablets or nasal spray) may not be helpful.  Children with persistent ear effusions may have delayed language or behavioral problems. Children at risk for developmental delays in hearing, learning, and speech may require referral to a specialist earlier than children not at risk.  You or your child's health care provider may suggest a referral to an ear, nose, and  throat surgeon for treatment. The following may help restore normal hearing:  Drainage of fluid.  Placement of ear tubes (tympanostomy tubes).  Removal of adenoids (adenoidectomy). HOME CARE INSTRUCTIONS   Avoid secondhand smoke.  Infants who are breastfed are less likely to have this condition.  Avoid feeding infants while they are lying flat.  Avoid known environmental allergens.  Avoid people who are sick. SEEK MEDICAL CARE IF:   Hearing is not better in 3 months.  Hearing is worse.  Ear pain.  Drainage from the ear.  Dizziness. MAKE SURE YOU:   Understand these instructions.  Will watch your condition.  Will get help right away if you are not doing well or get worse. Document Released: 08/12/2004 Document Revised: 11/19/2013 Document Reviewed: 01/30/2013 [facility] Patient Information 2015 [place]. This information is not intended to replace advice given to you by your health care provider. Make sure you discuss any questions you have with your health care provider.

## 2014-12-15 NOTE — ED Notes (Signed)
Pt  reports  Symptoms  Of     sorethroat   With  nasal congestion /  Drainage  For    Several  Days      Pt  Reports  Symptoms  Not  releived  By  Home  Remedies

## 2014-12-17 LAB — CULTURE, GROUP A STREP: Strep A Culture: NEGATIVE

## 2016-04-27 ENCOUNTER — Emergency Department (HOSPITAL_COMMUNITY): Payer: Commercial Managed Care - HMO

## 2016-04-27 ENCOUNTER — Emergency Department (HOSPITAL_COMMUNITY)
Admission: EM | Admit: 2016-04-27 | Discharge: 2016-04-27 | Disposition: A | Discharge status: Home / Self Care | Payer: Commercial Managed Care - HMO | Attending: Emergency Medicine | Admitting: Emergency Medicine

## 2016-04-27 ENCOUNTER — Encounter (HOSPITAL_COMMUNITY): Payer: Self-pay | Admitting: Emergency Medicine

## 2016-04-27 DIAGNOSIS — R072 Precordial pain: Secondary | ICD-10-CM | POA: Diagnosis present

## 2016-04-27 DIAGNOSIS — Z87891 Personal history of nicotine dependence: Secondary | ICD-10-CM | POA: Insufficient documentation

## 2016-04-27 DIAGNOSIS — M940 Chondrocostal junction syndrome [Tietze]: Secondary | ICD-10-CM | POA: Diagnosis not present

## 2016-04-27 DIAGNOSIS — R0789 Other chest pain: Secondary | ICD-10-CM

## 2016-04-27 LAB — BASIC METABOLIC PANEL
Anion gap: 6 (ref 5–15)
CO2: 22 mmol/L (ref 22–32)
Calcium: 8.9 mg/dL (ref 8.9–10.3)
Creatinine, Ser: 0.61 mg/dL (ref 0.44–1.00)
GFR calc Af Amer: >60 mL/min (ref >60)
GFR calc non Af Amer: >60 mL/min (ref >60)
Sodium: 139 mmol/L (ref 135–145)

## 2016-04-27 LAB — CBC
HCT: 39.7 % (ref 36.0–46.0)
Hemoglobin: 12.4 g/dL (ref 12.0–15.0)
MCH: 24.8 pg — ABNORMAL LOW (ref 26.0–34.0)
MCHC: 31.2 g/dL (ref 30.0–36.0)
MCV: 79.6 fL (ref 78.0–100.0)
Platelets: 326 10*3/uL (ref 150–400)
RBC: 4.99 MIL/uL (ref 3.87–5.11)
RDW: 14.2 % (ref 11.5–15.5)
WBC: 6 10*3/uL (ref 4.0–10.5)

## 2016-04-27 LAB — I-STAT TROPONIN, ED: Troponin i, poc: 0 ng/mL (ref 0.00–0.08)

## 2016-04-27 LAB — BASIC METABOLIC PANEL WITH GFR
BUN: 8 mg/dL (ref 6–20)
Chloride: 111 mmol/L (ref 101–111)
Glucose, Bld: 88 mg/dL (ref 65–99)
Potassium: 3.9 mmol/L (ref 3.5–5.1)

## 2016-04-27 LAB — I-STAT BETA HCG BLOOD, ED (MC, WL, AP ONLY): I-stat hCG, quantitative: <5 m[IU]/mL (ref <5)

## 2016-04-27 MED ORDER — IBUPROFEN 800 MG PO TABS: 800.0000 mg | ORAL_TABLET | Freq: Three times a day (TID) | ORAL | 0 refills | Status: DC | PRN

## 2016-04-27 MED ORDER — TRAMADOL HCL 50 MG PO TABS: 50.0000 mg | ORAL_TABLET | Freq: Four times a day (QID) | ORAL | 0 refills | Status: DC | PRN

## 2016-04-27 NOTE — ED Provider Notes (Signed)
[facility]EM[facility]Y DEPT Provider Note   CSN: [id number] Arrival date & time: 04/27/16  4098     History   Chief Complaint Chief Complaint  Patient presents with  . Chest Pain    HPI [name] is a 25 y.o. female.  HPI Patient presents to the emergency department with midsternal chest pain has been ongoing for 10 days.  Patient states the pain lasts for seconds at a time.  Patient states she did not take any medications prior to arrival.  She states that the pain is worse with certain movements and palpationThe patient denies shortness of breath, headache,blurred vision, neck pain, fever, cough, weakness, numbness, dizziness, anorexia, edema, abdominal pain, nausea, vomiting, diarrhea, rash, back pain, dysuria, hematemesis, bloody stool, near syncope, or syncope. Past Medical History:  Diagnosis Date  . Anemia     Patient Active Problem List   Diagnosis Date Noted  . Gestational hypertension 05/30/2013  . Unspecified vitamin D deficiency 05/30/2013  . Smoker 05/30/2013  . Severe obesity (BMI >= 40) ([facility]) 02/08/2013  . 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 UTI (urinary tract infection) in pregnancy 02/08/2013  . Back pain complicating pregnancy 02/08/2013    Past Surgical History:  Procedure Laterality Date  . CHOLECYSTECTOMY    . ELBOW FRACTURE SURGERY     BROKEN HUMEROUS  . WISDOM TOOTH EXTRACTION      OB History    Gravida Para Term Preterm AB Living   2 1 1   1 1    SAB TAB Ectopic Multiple Live Births     1     1       Home Medications    Prior to Admission medications   Medication Sig Start Date End Date Taking? Authorizing Provider  cholecalciferol (VITAMIN D) 1000 UNITS tablet Take 1,000 Units by mouth daily.    Yes Historical Provider, MD  ibuprofen (ADVIL,MOTRIN) 600 MG tablet Take 1 tablet (600 mg total) by mouth every 6 (six) hours. 06/03/13  Yes Haroldine Laws, CNM  ranitidine (ZANTAC) 150 MG capsule Take 150 mg by mouth 2 (two) times daily as needed  for heartburn.    Yes Historical Provider, MD  calcium carbonate (TUMS - DOSED IN MG ELEMENTAL CALCIUM) 500 MG chewable tablet Chew 2 tablets by mouth 3 (three) times daily as needed for heartburn.    Historical Provider, MD  oxyCODONE-acetaminophen (PERCOCET/ROXICET) 5-325 MG per tablet Take 1-2 tablets by mouth every 4 (four) hours as needed for severe pain (moderate - severe pain). Patient not taking: Reported on 04/27/2016 06/03/13   Haroldine Laws, CNM  Prenatal Vit-Fe Fumarate-FA (PRENATAL MULTIVITAMIN) TABS tablet Take 1 tablet by mouth daily.     Historical Provider, MD  triamcinolone (NASACORT AQ) 55 MCG/ACT AERO nasal inhaler Place 2 sprays into the nose daily. Patient not taking: Reported on 04/27/2016 12/15/14   Eustace Moore, MD    Family History Family History  Problem Relation Age of Onset  . Heart disease Paternal Grandmother   . Heart disease Maternal Grandmother   . Diabetes Maternal Grandmother   . Arthritis Father   . Hypertension Father   . Anemia Father   . Arthritis Mother   . Hypertension Mother     Social History Social History  Substance Use Topics  . Smoking status: Former Smoker    Packs/day: 0.10    Years: 2.00    Types: Cigarettes  . Smokeless tobacco: Never Used     Comment: quit after found out preg  . Alcohol  use No     Comment: OCCASIONAL     Allergies   Nickel and Other   Review of Systems Review of Systems  All other systems negative except as documented in the HPI. All pertinent positives and negatives as reviewed in the HPI. Physical Exam Updated Vital Signs BP 171/87 (BP Location: Right Arm)   Pulse 73   Temp 98.4 F (36.9 C) (Oral)   Resp 18   SpO2 100%   Physical Exam  Constitutional: She is oriented to person, place, and time. She appears well-developed and well-nourished. No distress.  HENT:  Head: Normocephalic and atraumatic.  Mouth/Throat: Oropharynx is clear and moist.  Eyes: Pupils are equal, round, and reactive  to light.  Neck: Normal range of motion. Neck supple.  Cardiovascular: Normal rate, regular rhythm and normal heart sounds.  Exam reveals no gallop and no friction rub.   No murmur heard. Pulmonary/Chest: Effort normal and breath sounds normal. No respiratory distress. She has no wheezes. She exhibits tenderness.  Abdominal: Soft. Bowel sounds are normal. She exhibits no distension. There is no tenderness.  Neurological: She is alert and oriented to person, place, and time. She exhibits normal muscle tone. Coordination normal.  Skin: Skin is warm and dry. Capillary refill takes less than 2 seconds. No rash noted. No erythema.  Psychiatric: She has a normal mood and affect. Her behavior is normal.  Nursing note and vitals reviewed.    ED Treatments / Results  Labs (all labs ordered are listed, but only abnormal results are displayed) Labs Reviewed  CBC - Abnormal; Notable for the following:       Result Value   MCH 24.8 (*)    All other components within normal limits  BASIC METABOLIC PANEL  I-STAT TROPOININ, ED  I-STAT BETA HCG BLOOD, ED ([facility], [facility], [facility] ONLY)    EKG  EKG Interpretation None       Radiology Dg Chest 2 View  Result Date: 04/27/2016 CLINICAL DATA:  Chest tightness for the past week.  Former smoker. EXAM: CHEST  2 VIEW COMPARISON:  None. FINDINGS: Examination is degraded due to patient body habitus. Normal cardiac silhouette and mediastinal contours. A cardiac lead overlies the peripheral aspect the right upper lung. No focal parenchymal opacities. No pleural effusion or pneumothorax. No evidence of edema. No acute osseous abnormalities. Post cholecystectomy. IMPRESSION: No acute cardiopulmonary disease. Electronically Signed   By: Simonne ComeJohn  Watts M.D.   On: 04/27/2016 11:02    Procedures Procedures (including critical care time)  Medications Ordered in ED Medications - No data to display   Initial Impression / Assessment and Plan / ED Course  I have reviewed the  triage vital signs and the nursing notes.  Pertinent labs & imaging results that were available during my care of the patient were reviewed by me and considered in my medical decision making (see chart for details).  Clinical Course    Patient be treated for chest wall pain.  Told to return here as needed.  Patient agrees the plan and all questions were answered.  Patient is low risk based on well's criteria and pertinent negative  Final Clinical Impressions(s) / ED Diagnoses   Final diagnoses:  None    New Prescriptions New Prescriptions   No medications on file     Charlestine NightChristopher Stefan Markarian, PA-C 04/29/16 1649    Loren Raceravid Yelverton, MD 05/02/16 [phone]

## 2016-04-27 NOTE — ED Notes (Signed)
Pt reports mid sternal cp which started x 10 days ago.  Worse with palpation and with movement.  Pt also c/o mild swelling in her feet as well.  Pt sitting in the chair in room, in NAD.  Pt is A&O x 4. Pt states mild swelling in her feet as well.  Pt states she drank some coke and belched and made the pain a "little better."

## 2016-04-27 NOTE — ED Triage Notes (Signed)
Pt sts mid sternal CP worse with movement, palpation and when lifting x 10 days; pt sts recent cough; pt sts some feet swelling over last several weeks

## 2016-04-27 NOTE — Discharge Instructions (Signed)
Return here as needed.  Follow-up with your primary care doctor °

## 2017-06-20 ENCOUNTER — Ambulatory Visit (HOSPITAL_COMMUNITY)
Admission: EM | Admit: 2017-06-20 | Discharge: 2017-06-20 | Discharge status: Against Medical Advice | Payer: Commercial Managed Care - HMO

## 2017-06-20 NOTE — ED Notes (Signed)
No answer x3 by patient access

## 2017-08-27 IMAGING — CR DG CHEST 2V
2 series · 2 of 2 positions shown · non-contrast
Comparison: None.

CLINICAL DATA: Chest tightness for the past week.  Former smoker.

EXAM:
CHEST  2 VIEW

[chest pa]
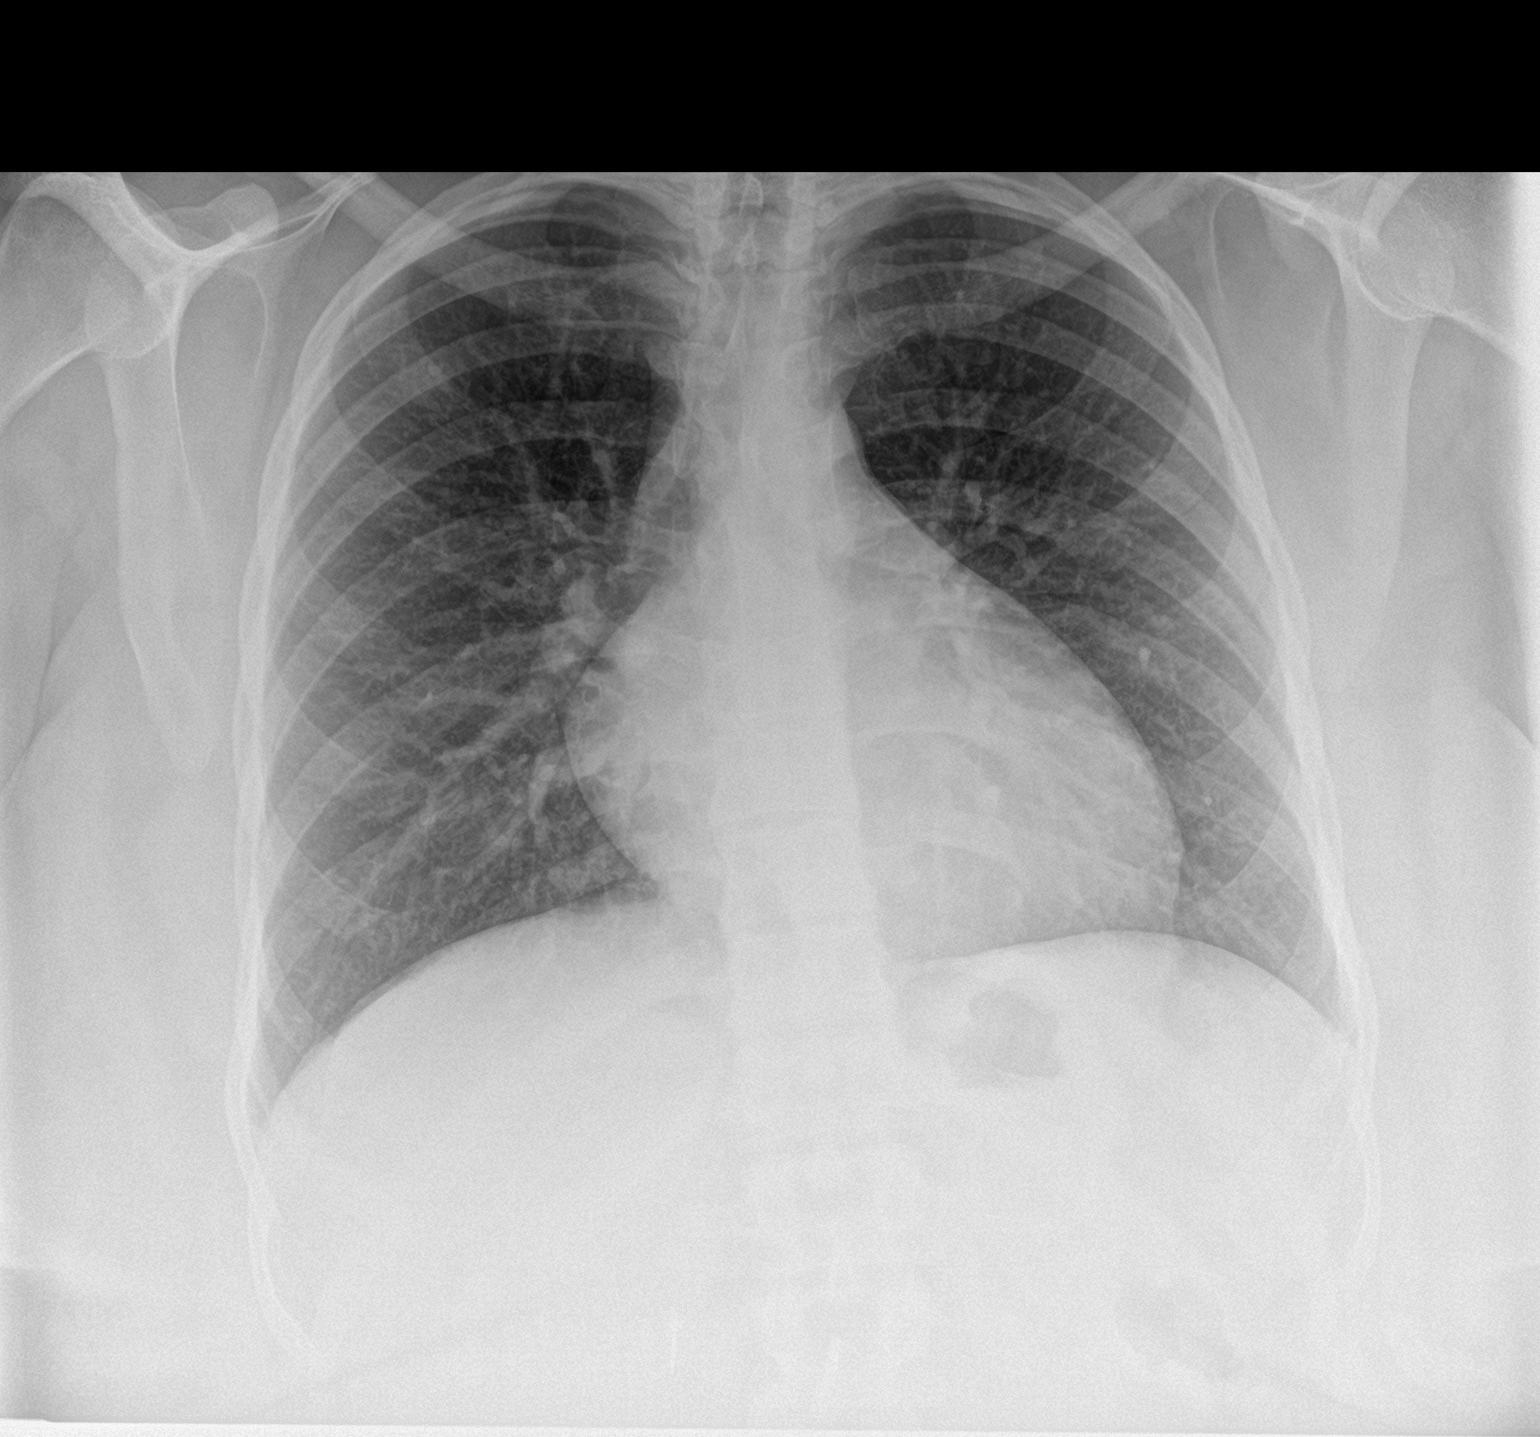

[chest lat]
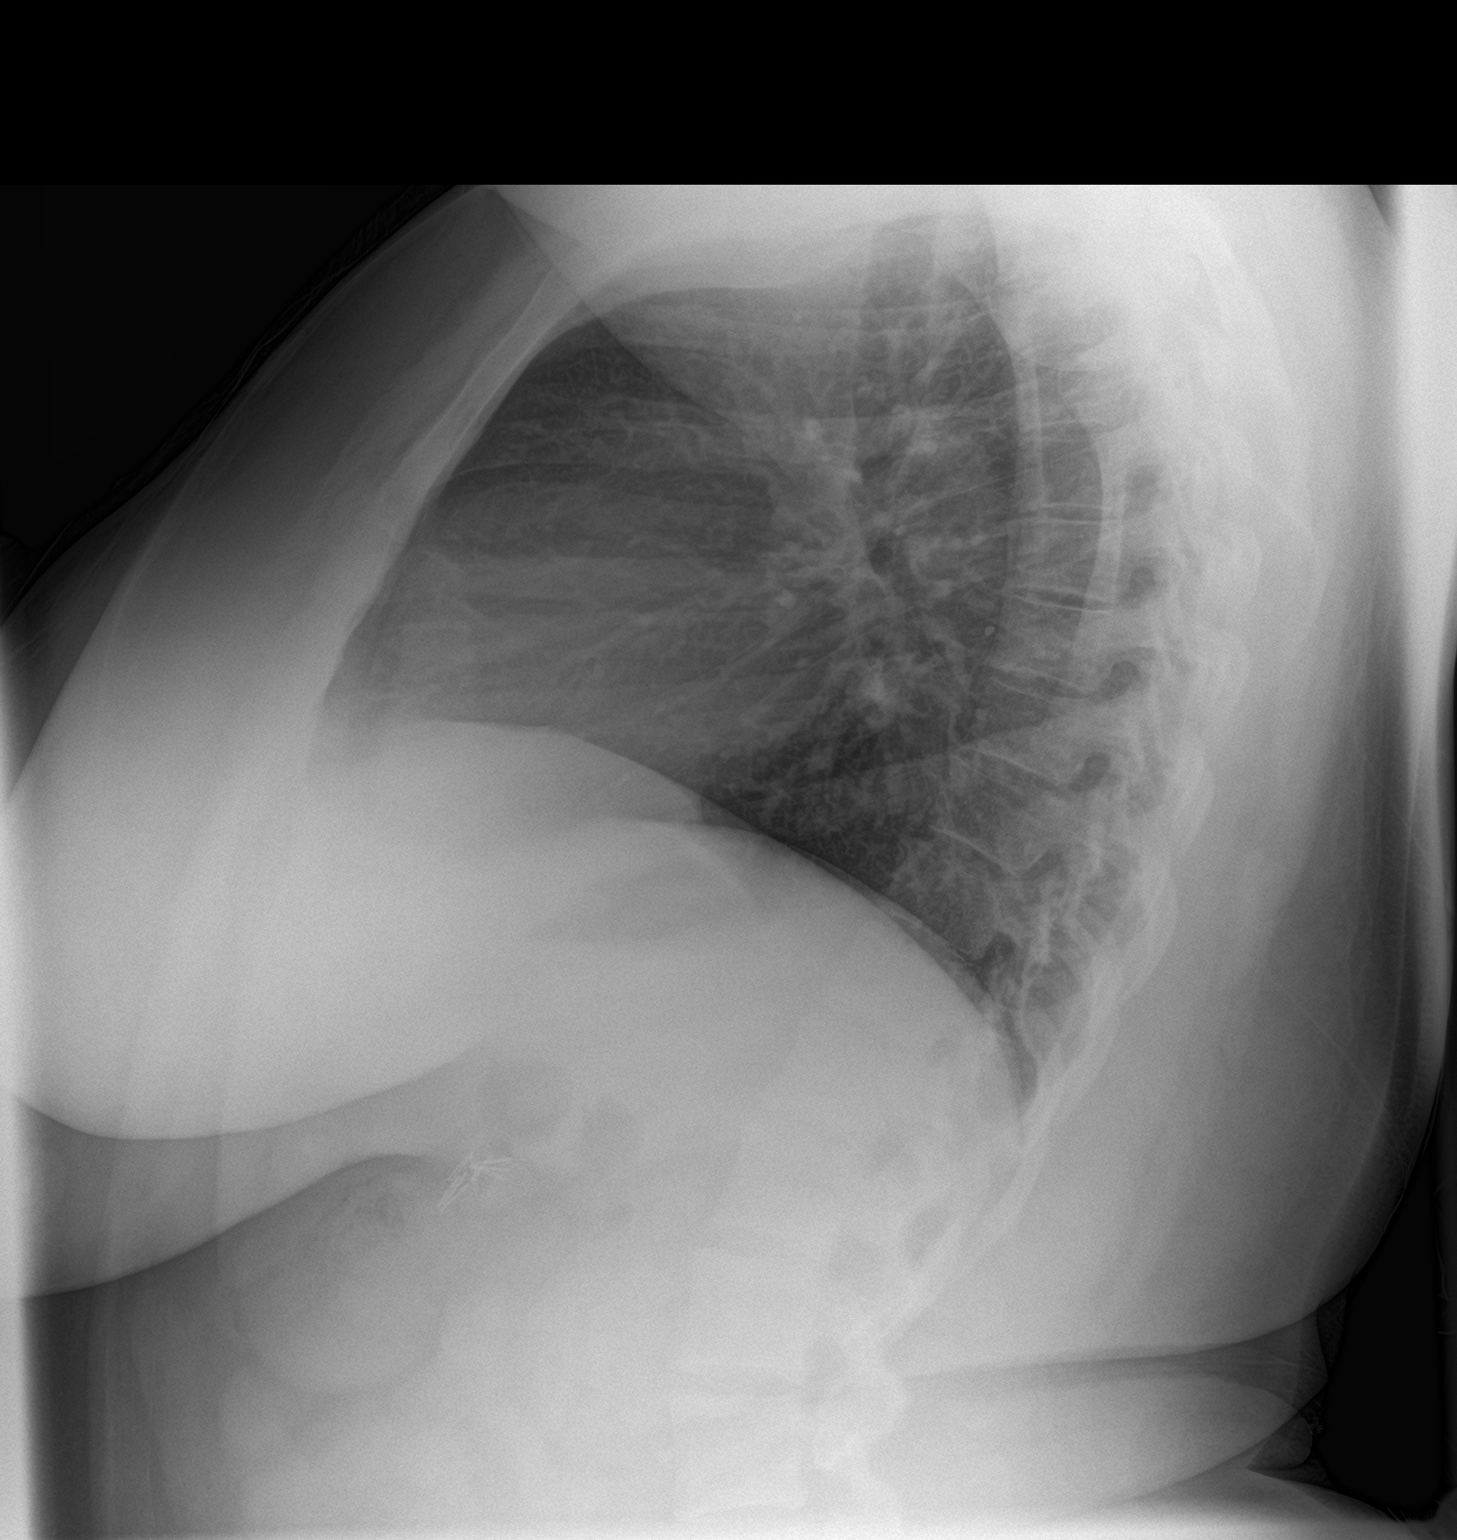

[2 of 2 positions shown; findings below may reference images not displayed]

FINDINGS: Examination is degraded due to patient body habitus.

Normal cardiac silhouette and mediastinal contours. A cardiac lead
overlies the peripheral aspect the right upper lung. No focal
parenchymal opacities. No pleural effusion or pneumothorax. No
evidence of edema. No acute osseous abnormalities. Post
cholecystectomy.
IMPRESSION: No acute cardiopulmonary disease.

## 2017-10-03 ENCOUNTER — Ambulatory Visit (HOSPITAL_COMMUNITY)
Admission: EM | Admit: 2017-10-03 | Discharge: 2017-10-03 | Disposition: A | Discharge status: Home / Self Care | Payer: BLUE CROSS/BLUE SHIELD | Attending: Urgent Care | Admitting: Urgent Care

## 2017-10-03 ENCOUNTER — Encounter (HOSPITAL_COMMUNITY): Payer: Self-pay | Admitting: Emergency Medicine

## 2017-10-03 DIAGNOSIS — H6690 Otitis media, unspecified, unspecified ear: Secondary | ICD-10-CM

## 2017-10-03 DIAGNOSIS — J3089 Other allergic rhinitis: Secondary | ICD-10-CM

## 2017-10-03 DIAGNOSIS — J22 Unspecified acute lower respiratory infection: Secondary | ICD-10-CM | POA: Diagnosis not present

## 2017-10-03 DIAGNOSIS — R05 Cough: Secondary | ICD-10-CM

## 2017-10-03 DIAGNOSIS — R059 Cough, unspecified: Secondary | ICD-10-CM

## 2017-10-03 DIAGNOSIS — R0789 Other chest pain: Secondary | ICD-10-CM

## 2017-10-03 DIAGNOSIS — J9801 Acute bronchospasm: Secondary | ICD-10-CM

## 2017-10-03 MED ORDER — METHYLPREDNISOLONE ACETATE 80 MG/ML IJ SUSP
INTRAMUSCULAR | Status: AC
  Filled 2017-10-03: qty 1

## 2017-10-03 MED ORDER — ALBUTEROL SULFATE HFA 108 (90 BASE) MCG/ACT IN AERS: 1.0000 | INHALATION_SPRAY | Freq: Four times a day (QID) | RESPIRATORY_TRACT | 0 refills | Status: AC | PRN

## 2017-10-03 MED ORDER — METHYLPREDNISOLONE ACETATE 80 MG/ML IJ SUSP
80.0000 mg | Freq: Once | INTRAMUSCULAR | Status: AC
  Administered 2017-10-03: 80 mg via INTRAMUSCULAR

## 2017-10-03 MED ORDER — BENZONATATE 100 MG PO CAPS: 100.0000 mg | ORAL_CAPSULE | Freq: Three times a day (TID) | ORAL | 0 refills | Status: DC | PRN

## 2017-10-03 MED ORDER — CETIRIZINE HCL 10 MG PO TABS: 10.0000 mg | ORAL_TABLET | Freq: Every day | ORAL | 1 refills | Status: AC

## 2017-10-03 MED ORDER — AMOXICILLIN-POT CLAVULANATE 875-125 MG PO TABS: 1.0000 | ORAL_TABLET | Freq: Two times a day (BID) | ORAL | 0 refills | Status: DC

## 2017-10-03 NOTE — ED Provider Notes (Signed)
  MRN: [id number] DOB: 11/20/1990  Subjective:   [name] is a 27 y.o. female presenting for 4 week history of persistent ear issues. She has completed a course of amoxicillin with some relief. Reports ~1 week history of productive cough, ear popping, sinus drainage, cough elicits wheezing and chest pain. Had chills, subjective fever, sore throat, headaches which have improved but other symptoms persist. Has tried DayQuil, NyQuil with some relief. Denies history of allergies, asthma. Denies smoking cigarettes.  [name] is not currently taking any medications and is allergic to nickel and other.  [name]  has a past medical history of Anemia. Also  has a past surgical history that includes Cholecystectomy; Elbow fracture surgery; and Wisdom tooth extraction.  Objective:   Vitals: BP (!) 125/50   Pulse 77   Temp 98.3 F (36.8 C)   Resp 18   LMP 10/03/2017   SpO2 100%   Physical Exam  Constitutional: She is oriented to person, place, and time. She appears well-developed and well-nourished.  HENT:  Right Ear: Tympanic membrane is erythematous. Tympanic membrane is not perforated, not retracted and not bulging.  Left Ear: Tympanic membrane normal.  Mouth/Throat: Oropharynx is clear and moist.  Eyes: Right eye exhibits no discharge. Left eye exhibits no discharge.  Neck: Normal range of motion. Neck supple.  Cardiovascular: Normal rate, regular rhythm and intact distal pulses. Exam reveals no gallop and no friction rub.  No murmur heard. Pulmonary/Chest: No respiratory distress. She has wheezes (bronchospasms with deep breathing). She has no rales.  Lymphadenopathy:    She has no cervical adenopathy.  Neurological: She is alert and oriented to person, place, and time.  Skin: Skin is warm and dry.  Psychiatric: She has a normal mood and affect.   Assessment and Plan :   Lower respiratory infection  Cough  Atypical chest pain  Acute otitis media, unspecified otitis media  type  Allergic rhinitis due to other allergic trigger, unspecified seasonality  Bronchospasm  Start Augmentin for otitis media. IM Depomedrol for persistent cough, wheezing, bronchospasms. Supportive care recommended otherwise. Patient was in favor of aggressive treatment plan. Counseled patient on potential for adverse effects with medications prescribed today, patient verbalized understanding. Return-to-clinic precautions discussed, patient verbalized understanding.    Wallis BambergMani, Shila Kruczek, PA-C 10/03/17 2240

## 2017-10-03 NOTE — Discharge Instructions (Signed)
Hydrate well with at least 2 liters (1 gallon) of water daily. You may take 500mg  Tylenol with ibuprofen 400-600mg  every 6 hours for pain and inflammation. For sore throat try using a honey-based tea. Use 3 teaspoons of honey with juice squeezed from half lemon. Place shaved pieces of ginger into 1/2-1 cup of water and warm over stove top. Then mix the ingredients and repeat every 4 hours as needed.

## 2017-10-03 NOTE — ED Triage Notes (Signed)
Pt states for the last month shes been sick, double ear infections, treated with antibiotics. Pt states both her ears keep popping. Pt also states shes had a cough x2 weeks.

## 2017-10-25 DIAGNOSIS — H6501 Acute serous otitis media, right ear: Secondary | ICD-10-CM | POA: Insufficient documentation

## 2017-10-25 DIAGNOSIS — J302 Other seasonal allergic rhinitis: Secondary | ICD-10-CM | POA: Insufficient documentation

## 2017-10-25 DIAGNOSIS — H6993 Unspecified Eustachian tube disorder, bilateral: Secondary | ICD-10-CM | POA: Insufficient documentation

## 2017-10-25 DIAGNOSIS — R42 Dizziness and giddiness: Secondary | ICD-10-CM | POA: Insufficient documentation

## 2019-03-02 ENCOUNTER — Other Ambulatory Visit: Payer: Self-pay

## 2019-03-02 DIAGNOSIS — Z20822 Contact with and (suspected) exposure to covid-19: Secondary | ICD-10-CM

## 2019-03-03 LAB — NOVEL CORONAVIRUS, NAA: SARS-CoV-2, NAA: NOT DETECTED

## 2019-06-01 ENCOUNTER — Other Ambulatory Visit: Payer: Self-pay

## 2019-06-01 DIAGNOSIS — Z20822 Contact with and (suspected) exposure to covid-19: Secondary | ICD-10-CM

## 2019-06-04 LAB — NOVEL CORONAVIRUS, NAA: SARS-CoV-2, NAA: NOT DETECTED

## 2019-06-05 ENCOUNTER — Telehealth: Payer: Self-pay | Admitting: *Deleted

## 2019-06-05 NOTE — Telephone Encounter (Signed)
Patient given NEGATIVE ,COVID results ,expressed understanding.

## 2020-01-10 ENCOUNTER — Other Ambulatory Visit: Payer: Self-pay

## 2020-01-10 ENCOUNTER — Ambulatory Visit
Admission: EM | Admit: 2020-01-10 | Discharge: 2020-01-10 | Disposition: A | Discharge status: Home / Self Care | Payer: BLUE CROSS/BLUE SHIELD | Attending: Emergency Medicine | Admitting: Emergency Medicine

## 2020-01-10 DIAGNOSIS — H9202 Otalgia, left ear: Secondary | ICD-10-CM

## 2020-01-10 MED ORDER — FLUTICASONE PROPIONATE 50 MCG/ACT NA SUSP: 1.0000 | Freq: Every day | NASAL | 0 refills | Status: DC

## 2020-01-10 NOTE — ED Provider Notes (Signed)
[name]    CSN: [id number] Arrival date & time: 01/10/20  0910      History   Chief Complaint Chief Complaint  Patient presents with  . Otalgia    HPI [name] is a 29 y.o. female presenting for left ear pain x2 days.  No trauma, recent travel,, discharge or bleeding.  Denies fever, change in hearing, tinnitus or dizziness.  No neck pain, sore throat, cough, difficulty breathing or chest pain.   Past Medical History:  Diagnosis Date  . Anemia     Patient Active Problem List   Diagnosis Date Noted  . Gestational hypertension 05/30/2013  . Unspecified vitamin D deficiency 05/30/2013  . Smoker 05/30/2013  . Severe obesity (BMI >= 40) ([facility]) 02/08/2013  . 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 UTI (urinary tract infection) in pregnancy 02/08/2013  . Back pain complicating pregnancy 28/31/5176    Past Surgical History:  Procedure Laterality Date  . CHOLECYSTECTOMY    . ELBOW FRACTURE SURGERY     BROKEN HUMEROUS  . WISDOM TOOTH EXTRACTION      OB History    Gravida  2   Para  1   Term  1   Preterm      AB  1   Living  1     SAB      TAB  1   Ectopic      Multiple      Live Births  1            Home Medications    Prior to Admission medications   Medication Sig Start Date End Date Taking? Authorizing Provider  albuterol (PROVENTIL HFA;VENTOLIN HFA) 108 (90 Base) MCG/ACT inhaler Inhale 1-2 puffs into the lungs every 6 (six) hours as needed for wheezing or shortness of breath. 10/03/17   Jaynee Eagles, PA-C  cetirizine (ZYRTEC ALLERGY) 10 MG tablet Take 1 tablet (10 mg total) by mouth daily. 10/03/17   Jaynee Eagles, PA-C  fluticasone (FLONASE) 50 MCG/ACT nasal spray Place 1 spray into both nostrils daily. 01/10/20   Hall-Potvin, [place], PA-C    Family History Family History  Problem Relation Age of Onset  . Heart disease Paternal Grandmother   . Heart disease Maternal Grandmother   . Diabetes Maternal Grandmother   . Arthritis  Father   . Hypertension Father   . Anemia Father   . Arthritis Mother   . Hypertension Mother     Social History Social History   Tobacco Use  . Smoking status: Former Smoker    Packs/day: 0.10    Years: 2.00    Pack years: 0.20    Types: Cigarettes  . Smokeless tobacco: Never Used  . Tobacco comment: quit after found out preg  Substance Use Topics  . Alcohol use: No    Comment: OCCASIONAL  . Drug use: No     Allergies   Nickel and Other   Review of Systems As per HPI   Physical Exam Triage Vital Signs ED Triage Vitals  Enc Vitals Group     BP      Pulse      Resp      Temp      Temp src      SpO2      Weight      Height      Head Circumference      Peak Flow      Pain Score      Pain Loc  Pain Edu?      Excl. in GC?    No data found.  Updated Vital Signs BP 137/89 (BP Location: Left Arm)   Pulse 66   Temp 98.7 F (37.1 C) (Oral)   Resp 18   LMP 12/16/2019   SpO2 97%   Breastfeeding No   Visual Acuity Right Eye Distance:   Left Eye Distance:   Bilateral Distance:    Right Eye Near:   Left Eye Near:    Bilateral Near:     Physical Exam Constitutional:      General: She is not in acute distress. HENT:     Head: Normocephalic and atraumatic.     Right Ear: Tympanic membrane, ear canal and external ear normal.     Left Ear: Tympanic membrane, ear canal and external ear normal.  Eyes:     General: No scleral icterus.    Pupils: Pupils are equal, round, and reactive to light.  Cardiovascular:     Rate and Rhythm: Normal rate.  Pulmonary:     Effort: Pulmonary effort is normal.  Skin:    Coloration: Skin is not jaundiced or pale.  Neurological:     Mental Status: She is alert and oriented to person, place, and time.      UC Treatments / Results  Labs (all labs ordered are listed, but only abnormal results are displayed) Labs Reviewed - No data to display  EKG   Radiology No results found.  Procedures Procedures  (including critical Boyer time)  Medications Ordered in UC Medications - No data to display  Initial Impression / Assessment and Plan / UC Course  I have reviewed the triage vital signs and the nursing notes.  Pertinent labs & imaging results that were available during my Boyer of the patient were reviewed by me and considered in my medical decision making (see chart for details).     Patient febrile, nontoxic.  Ear exam unremarkable: Reviewed supportive Boyer as outlined below.  Return precautions discussed, patient verbalized understanding and is agreeable to plan. Final Clinical Impressions(s) / UC Diagnoses   Final diagnoses:  Left ear pain     Discharge Instructions     Use Flonase: 2 sprays in each nostril once daily. Return for worsening ear pain, swelling, discharge, bleeding, decreased hearing, development of jaw pain/swelling, fever.  Do NOT use Q-tips as these can cause your ear wax to get stuck, the tips may break off and become a foreign body requiring additional medical Boyer, or puncture your eardrum.  Helpful prevention tip: Use a solution of equal parts isopropyl (rubbing) alcohol and white vinegar (acetic acid) in both ears after swimming.    ED Prescriptions    Medication Sig Dispense Auth. Provider   fluticasone (FLONASE) 50 MCG/ACT nasal spray Place 1 spray into both nostrils daily. 16 g Hall-Potvin, [place], PA-C     PDMP not reviewed this encounter.   Hall-Potvin, [place], [place] 01/10/20 1603

## 2020-01-10 NOTE — Discharge Instructions (Signed)
Use Flonase: 2 sprays in each nostril once daily. Return for worsening ear pain, swelling, discharge, bleeding, decreased hearing, development of jaw pain/swelling, fever.  Do NOT use Q-tips as these can cause your ear wax to get stuck, the tips may break off and become a foreign body requiring additional medical care, or puncture your eardrum.  Helpful prevention tip: Use a solution of equal parts isopropyl (rubbing) alcohol and white vinegar (acetic acid) in both ears after swimming.

## 2020-01-10 NOTE — ED Triage Notes (Signed)
Pt c/o lt ear pain x2 days with a hx of ear infections.

## 2020-02-18 ENCOUNTER — Other Ambulatory Visit: Payer: Self-pay

## 2020-02-18 ENCOUNTER — Ambulatory Visit: Payer: Self-pay | Admitting: Nurse Practitioner

## 2020-02-18 ENCOUNTER — Encounter: Payer: Self-pay | Admitting: Nurse Practitioner

## 2020-02-18 VITALS — BP 132/84 | HR 88 | Temp 98.4°F | Ht 64.0 in | Wt 270.0 lb

## 2020-02-18 DIAGNOSIS — Z13228 Encounter for screening for other metabolic disorders: Secondary | ICD-10-CM

## 2020-02-18 DIAGNOSIS — Z1159 Encounter for screening for other viral diseases: Secondary | ICD-10-CM

## 2020-02-18 DIAGNOSIS — F320 Major depressive disorder, single episode, mild: Secondary | ICD-10-CM

## 2020-02-18 DIAGNOSIS — F419 Anxiety disorder, unspecified: Secondary | ICD-10-CM | POA: Insufficient documentation

## 2020-02-18 DIAGNOSIS — F32A Depression, unspecified: Secondary | ICD-10-CM | POA: Insufficient documentation

## 2020-02-18 MED ORDER — MAGNESIUM 250 MG PO TABS: ORAL_TABLET | ORAL | 1 refills | Status: AC

## 2020-02-18 MED ORDER — HYDROXYZINE PAMOATE 25 MG PO CAPS: 25.0000 mg | ORAL_CAPSULE | Freq: Three times a day (TID) | ORAL | 2 refills | Status: DC | PRN

## 2020-02-18 NOTE — Progress Notes (Signed)
This visit occurred during the SARS-CoV-2 public health emergency.  Safety protocols were in place, including screening questions prior to the visit, additional usage of staff PPE, and extensive cleaning of exam room while observing appropriate contact time as indicated for disinfecting solutions.  Subjective:     Patient ID: [name] , female    DOB: [birth year] , 29 y.o.   MRN: [id number]   Chief Complaint  Patient presents with  . Hypertension    HPI  She has not been in the office since before Covid.  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    Wt Readings from Last 3 Encounters: 02/18/20 : 270 lb (122.5 kg) 05/30/13 : 267 lb (121.1 kg) 05/26/13 : 273 lb 3.2 oz (123.9 kg)   She has been homeschooling her child who is in kindergarten. She has also been stressed about her business.   She has been having recurrent ear infections. She would like to go back to the ENT. She is also having vertigo as well.      Anxiety Presents for initial visit. The problem has been unchanged. Symptoms include nervous/anxious behavior. Patient reports no decreased concentration, depressed mood or dizziness. Symptoms occur rarely.   Her past medical history is significant for anxiety/panic attacks. There is no history of anemia.     Past Medical History:  Diagnosis Date  . Anemia      Family History  Problem Relation Age of Onset  . Heart disease Paternal Grandmother   . Heart disease Maternal Grandmother   . Diabetes Maternal Grandmother   . Arthritis Father   . Hypertension Father   . Anemia Father   . Arthritis Mother   . Hypertension Mother      Current Outpatient Medications:  .  cetirizine (ZYRTEC ALLERGY) 10 MG tablet, Take 1 tablet (10 mg total) by mouth daily., Disp: 90 tablet, Rfl: 1 .  fluticasone (FLONASE) 50 MCG/ACT nasal spray, Place 1 spray into both nostrils daily., Disp: 16 g, Rfl: 0 .  Multiple Vitamins-Minerals (WOMENS  MULTIVITAMIN PO), Take by mouth., Disp: , Rfl:  .  albuterol (PROVENTIL HFA;VENTOLIN HFA) 108 (90 Base) MCG/ACT inhaler, Inhale 1-2 puffs into the lungs every 6 (six) hours as needed for wheezing or shortness of breath. (Patient not taking: Reported on 02/18/2020), Disp: 1 Inhaler, Rfl: 0 .  Magnesium 250 MG TABS, Take 1 tablet by mouth with evening meal, Disp: 30 tablet, Rfl: 1   Allergies  Allergen Reactions  . Nickel Hives, Itching and Rash  . Other Hives, Itching and Rash    PT IS ALLERGIC TO GRASS     Review of Systems  Constitutional: Negative.  Negative for fatigue.  Respiratory: Negative.  Negative for cough.   Cardiovascular: Negative.  Negative for leg swelling.  Neurological: Negative for dizziness.  Psychiatric/Behavioral: Negative for decreased concentration. The patient is nervous/anxious.      Today's Vitals   02/18/20 1608  BP: 132/84  Pulse: 88  Temp: 98.4 F (36.9 C)  TempSrc: Oral  Weight: 270 lb (122.5 kg)  Height: '5\' 4"'  (1.626 m)  PainSc: 0-No pain   Body mass index is 46.35 kg/m.   Objective:  Physical Exam Vitals reviewed.  Constitutional:      General: She is not in acute distress.    Appearance: Normal appearance. She is obese.  Cardiovascular:     Rate and Rhythm: Normal rate and regular rhythm.     Pulses: Normal  pulses.     Heart sounds: Normal heart sounds. No murmur heard.   Pulmonary:     Effort: Pulmonary effort is normal. No respiratory distress.     Breath sounds: Normal breath sounds.  Skin:    Capillary Refill: Capillary refill takes less than 2 seconds.  Neurological:     General: No focal deficit present.     Mental Status: She is alert and oriented to person, place, and time.     Cranial Nerves: No cranial nerve deficit.  Psychiatric:        Mood and Affect: Mood normal.        Behavior: Behavior normal.        Thought Content: Thought content normal.        Judgment: Judgment normal.         Assessment And Plan:      1. Anxiety Comments: - she would like to go to counseling, she has seen a therapist in the past. - TSH - Magnesium 250 MG TABS; Take 1 tablet by mouth with evening meal  Dispense: 30 tablet; Refill: 1 - Ambulatory referral to Psychology  2. Mild depression ([place]) Comments: - will send for counseling and she is encouraged to increase her physical activity level  3. Severe obesity (BMI >= 40) (HCC)  Chronic  Discussed healthy diet and regular exercise options   Encouraged to exercise at least 150 minutes per week with 2 days of strength training - CMP14+EGFR - Hemoglobin A1c  4. Encounter for screening for metabolic disorder - Lipid panel - Hemoglobin A1c  5. Encounter for hepatitis C screening test for low risk patient  Will check Hepatitis C screening due to recent recommendations to screen all adults 18 years and older - Hepatitis C antibody     Patient was given opportunity to ask questions. Patient verbalized understanding of the plan and was able to repeat key elements of the plan. All questions were answered to their satisfaction.  Minette Brine, FNP   I, Minette Brine, FNP, have reviewed all documentation for this visit. The documentation on 02/21/20 for the exam, diagnosis, procedures, and orders are all accurate and complete.  THE PATIENT IS ENCOURAGED TO PRACTICE SOCIAL DISTANCING DUE TO THE COVID-19 PANDEMIC.

## 2020-02-19 LAB — CMP14+EGFR
ALT: 24 IU/L (ref 0–32)
AST: 15 IU/L (ref 0–40)
Albumin/Globulin Ratio: 1.6 (ref 1.2–2.2)
Albumin: 4 g/dL (ref 3.9–5.0)
Alkaline Phosphatase: 81 IU/L (ref 48–121)
BUN/Creatinine Ratio: 16 (ref 9–23)
BUN: 13 mg/dL (ref 6–20)
Bilirubin Total: 0.2 mg/dL (ref 0.0–1.2)
CO2: 24 mmol/L (ref 20–29)
Calcium: 9.3 mg/dL (ref 8.7–10.2)
Chloride: 104 mmol/L (ref 96–106)
Creatinine, Ser: 0.83 mg/dL (ref 0.57–1.00)
GFR calc Af Amer: 111 mL/min/{1.73_m2} (ref >59)
GFR calc non Af Amer: 96 mL/min/{1.73_m2} (ref >59)
Globulin, Total: 2.5 g/dL (ref 1.5–4.5)
Glucose: 84 mg/dL (ref 65–99)
Potassium: 4.2 mmol/L (ref 3.5–5.2)
Sodium: 139 mmol/L (ref 134–144)
Total Protein: 6.5 g/dL (ref 6.0–8.5)

## 2020-02-19 LAB — LIPID PANEL
Chol/HDL Ratio: 3.5 ratio (ref 0.0–4.4)
Cholesterol, Total: 138 mg/dL (ref 100–199)
HDL: 40 mg/dL (ref >39)
LDL Chol Calc (NIH): 72 mg/dL (ref 0–99)
Triglycerides: 147 mg/dL (ref 0–149)
VLDL Cholesterol Cal: 26 mg/dL (ref 5–40)

## 2020-02-19 LAB — TSH: TSH: 0.599 u[IU]/mL (ref 0.450–4.500)

## 2020-02-19 LAB — HEPATITIS C ANTIBODY: Hep C Virus Ab: <0.1 s/co ratio (ref 0.0–0.9)

## 2020-02-19 LAB — HEMOGLOBIN A1C
Est. average glucose Bld gHb Est-mCnc: 105 mg/dL
Hgb A1c MFr Bld: 5.3 % (ref 4.8–5.6)

## 2020-02-20 ENCOUNTER — Telehealth: Payer: Self-pay

## 2020-02-20 NOTE — Telephone Encounter (Signed)
-----   Message from Arnette Felts, FNP sent at 02/19/2020  6:04 PM EDT ----- All of your labs are normal, negative for Hepatitis

## 2020-02-20 NOTE — Telephone Encounter (Signed)
Left vm for pt to call back for lab results   

## 2020-05-21 ENCOUNTER — Encounter: Payer: Self-pay | Admitting: Nurse Practitioner

## 2020-06-02 ENCOUNTER — Other Ambulatory Visit: Payer: Self-pay

## 2020-06-02 ENCOUNTER — Ambulatory Visit
Admission: EM | Admit: 2020-06-02 | Discharge: 2020-06-02 | Disposition: A | Discharge status: Home / Self Care | Payer: BLUE CROSS/BLUE SHIELD | Attending: Physician Assistant | Admitting: Physician Assistant

## 2020-06-02 DIAGNOSIS — M545 Low back pain, unspecified: Secondary | ICD-10-CM | POA: Diagnosis not present

## 2020-06-02 MED ORDER — MELOXICAM 7.5 MG PO TABS: 7.5000 mg | ORAL_TABLET | Freq: Every day | ORAL | 0 refills | Status: DC

## 2020-06-02 MED ORDER — TIZANIDINE HCL 2 MG PO TABS: 2.0000 mg | ORAL_TABLET | Freq: Three times a day (TID) | ORAL | 0 refills | Status: DC | PRN
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
## 2020-06-05 ENCOUNTER — Ambulatory Visit
Admission: EM | Admit: 2020-06-05 | Discharge: 2020-06-05 | Disposition: A | Discharge status: Home / Self Care | Payer: Self-pay

## 2020-06-05 DIAGNOSIS — M545 Low back pain, unspecified: Secondary | ICD-10-CM
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
## 2020-06-05 NOTE — Discharge Instructions (Signed)
Start mobic daily for a few days to help with inflammation. Tizanidine as needed. Ice/heat compresses as needed. Follow up with PCP/orthopedics if symptoms worsen, changes for reevaluation. If experience numbness/tingling of the inner thighs, loss of bladder or bowel control, go to the emergency department for evaluation.

## 2020-07-29 ENCOUNTER — Encounter: Payer: Self-pay | Admitting: Emergency Medicine

## 2020-07-29 ENCOUNTER — Other Ambulatory Visit: Payer: Self-pay

## 2020-07-29 ENCOUNTER — Ambulatory Visit
Admission: EM | Admit: 2020-07-29 | Discharge: 2020-07-29 | Disposition: A | Discharge status: Home / Self Care | Payer: Self-pay | Attending: Internal Medicine | Admitting: Internal Medicine

## 2020-07-29 DIAGNOSIS — A084 Viral intestinal infection, unspecified: Secondary | ICD-10-CM

## 2020-07-29 LAB — POCT INFLUENZA A/B
Influenza A, POC: NEGATIVE
Influenza B, POC: NEGATIVE

## 2020-07-29 NOTE — Discharge Instructions (Signed)
Your flu test was negative.  You can continue to treat your symptoms with tylenol and ibuprofen.  This is a viral infection that should improve over the next week.

## 2020-07-29 NOTE — ED Triage Notes (Signed)
Pt here for fever x 3 days; pt had negative covid test yesterday

## 2020-07-29 NOTE — ED Provider Notes (Signed)
[name]    CSN: [id number] Arrival date & time: 07/29/20  1430      History   Chief Complaint Chief Complaint  Patient presents with  . Fever    HPI [name] is a 30 y.o. female.   Patient complaining of three days of fever/chills, headache, cough, body aches, abdominal pain and nonbloody diarrhea.  She took a rapid covid test the day after symptoms started and it was negative.  She is not vaccinated against covid or the flu.  Tmax was 101 and most recent temp was 100.7 today.  She denies nausea or vomiting, sore throat, and dysuria.       Past Medical History:  Diagnosis Date  . Anemia     Patient Active Problem List   Diagnosis Date Noted  . Mild depression (HCC) 02/18/2020  . Anxiety 02/18/2020  . Gestational hypertension 05/30/2013  . Unspecified vitamin D deficiency 05/30/2013  . Smoker 05/30/2013  . Severe obesity (BMI >= 40) (HCC) 02/08/2013  . 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 UTI (urinary tract infection) in pregnancy 02/08/2013  . Back pain complicating pregnancy 02/08/2013    Past Surgical History:  Procedure Laterality Date  . CHOLECYSTECTOMY    . ELBOW FRACTURE SURGERY     BROKEN HUMEROUS  . WISDOM TOOTH EXTRACTION      OB History    Gravida  2   Para  1   Term  1   Preterm      AB  1   Living  1     SAB      IAB  1   Ectopic      Multiple      Live Births  1            Home Medications    Prior to Admission medications   Medication Sig Start Date End Date Taking? Authorizing Provider  albuterol (PROVENTIL HFA;VENTOLIN HFA) 108 (90 Base) MCG/ACT inhaler Inhale 1-2 puffs into the lungs every 6 (six) hours as needed for wheezing or shortness of breath. Patient not taking: Reported on 02/18/2020 10/03/17   Wallis Bamberg, PA-C  cetirizine (ZYRTEC ALLERGY) 10 MG tablet Take 1 tablet (10 mg total) by mouth daily. 10/03/17   Wallis Bamberg, PA-C  fluticasone (FLONASE) 50 MCG/ACT nasal spray Place 1 spray into  both nostrils daily. 01/10/20   Hall-Potvin, [place], PA-C  Magnesium 250 MG TABS Take 1 tablet by mouth with evening meal 02/18/20   Arnette Felts, FNP  meloxicam (MOBIC) 7.5 MG tablet Take 1 tablet (7.5 mg total) by mouth daily. Patient not taking: Reported on 07/29/2020 06/02/20   Belinda Fisher, PA-C  Multiple Vitamins-Minerals (WOMENS MULTIVITAMIN PO) Take by mouth.    [provider]  tiZANidine (ZANAFLEX) 2 MG tablet Take 1 tablet (2 mg total) by mouth every 8 (eight) hours as needed for muscle spasms. Patient not taking: Reported on 07/29/2020 06/02/20   Lurline Idol    Family History Family History  Problem Relation Age of Onset  . Heart disease Paternal Grandmother   . Heart disease Maternal Grandmother   . Diabetes Maternal Grandmother   . Arthritis Father   . Hypertension Father   . Anemia Father   . Arthritis Mother   . Hypertension Mother     Social History Social History   Tobacco Use  . Smoking status: Former Smoker    Packs/day: 0.10    Years: 2.00    Pack years: 0.20  Types: Cigarettes  . Smokeless tobacco: Never Used  . Tobacco comment: quit after found out preg  Substance Use Topics  . Alcohol use: No    Comment: OCCASIONAL  . Drug use: No     Allergies   Nickel and Other   Review of Systems Review of Systems  All other systems reviewed and are negative.    Physical Exam Triage Vital Signs ED Triage Vitals  Enc Vitals Group     BP 07/29/20 1550 (!) 146/87     Pulse Rate 07/29/20 1550 77     Resp 07/29/20 1550 18     Temp 07/29/20 1550 98.7 F (37.1 C)     Temp Source 07/29/20 1550 Oral     SpO2 07/29/20 1550 98 %     Weight --      Height --      Head Circumference --      Peak Flow --      Pain Score 07/29/20 1553 8     Pain Loc --      Pain Edu? --      Excl. in GC? --    No data found.  Updated Vital Signs BP (!) 146/87   Pulse 77   Temp 98.7 F (37.1 C) (Oral)   Resp 18   SpO2 98%   Visual Acuity Right Eye  Distance:   Left Eye Distance:   Bilateral Distance:    Right Eye Near:   Left Eye Near:    Bilateral Near:     Physical Exam Constitutional:      Appearance: She is obese.  HENT:     Head: Normocephalic.     Nose: Nose normal. No congestion.     Mouth/Throat:     Mouth: Mucous membranes are moist.     Pharynx: Oropharynx is clear. No oropharyngeal exudate.  Eyes:     Conjunctiva/sclera: Conjunctivae normal.     Pupils: Pupils are equal, round, and reactive to light.  Cardiovascular:     Rate and Rhythm: Normal rate and regular rhythm.     Pulses: Normal pulses.     Heart sounds: No murmur heard.   Pulmonary:     Effort: Pulmonary effort is normal. No respiratory distress.     Breath sounds: No wheezing or rales.  Abdominal:     Palpations: Abdomen is soft.     Comments: Diffuse mild tenderness.    Musculoskeletal:        General: Normal range of motion.     Cervical back: Normal range of motion. No rigidity.  Lymphadenopathy:     Cervical: No cervical adenopathy.  Skin:    General: Skin is warm and dry.  Neurological:     Mental Status: She is alert.      UC Treatments / Results  Labs (all labs ordered are listed, but only abnormal results are displayed) Labs Reviewed  POCT INFLUENZA A/B    EKG   Radiology No results found.  Procedures Procedures (including critical care time)  Medications Ordered in UC Medications - No data to display  Initial Impression / Assessment and Plan / UC Course  I have reviewed the triage vital signs and the nursing notes.  Pertinent labs & imaging results that were available during my care of the patient were reviewed by me and considered in my medical decision making (see chart for details).     Patient presents with 3 days of flu-like symptoms including cough, fever/chills, myalgia..  covid  test was negative.  Rapid flu test was negative today. enteritis Likely due to unknown viral cause. Provided work note.   Advised pt on symptomatic treatment.    Final Clinical Impressions(s) / UC Diagnoses   Final diagnoses:  Viral gastroenteritis     Discharge Instructions     Your flu test was negative.  You can continue to treat your symptoms with tylenol and ibuprofen.  This is a viral infection that should improve over the next week.     ED Prescriptions    None     PDMP not reviewed this encounter.   Sandre Kitty, MD 07/29/20 1700

## 2021-03-10 ENCOUNTER — Emergency Department (HOSPITAL_COMMUNITY)
Admission: EM | Admit: 2021-03-10 | Discharge: 2021-03-10 | Disposition: A | Discharge status: Home / Self Care | Payer: Self-pay | Attending: Emergency Medicine | Admitting: Emergency Medicine

## 2021-03-10 ENCOUNTER — Emergency Department (HOSPITAL_COMMUNITY): Payer: Self-pay

## 2021-03-10 ENCOUNTER — Encounter (HOSPITAL_COMMUNITY): Payer: Self-pay | Admitting: Emergency Medicine

## 2021-03-10 ENCOUNTER — Ambulatory Visit: Admission: EM | Admit: 2021-03-10 | Discharge: 2021-03-10 | Discharge status: Absent without leave | Payer: Self-pay

## 2021-03-10 DIAGNOSIS — R5383 Other fatigue: Secondary | ICD-10-CM | POA: Insufficient documentation

## 2021-03-10 DIAGNOSIS — Z87891 Personal history of nicotine dependence: Secondary | ICD-10-CM | POA: Insufficient documentation

## 2021-03-10 DIAGNOSIS — R059 Cough, unspecified: Secondary | ICD-10-CM | POA: Insufficient documentation

## 2021-03-10 DIAGNOSIS — R0602 Shortness of breath: Secondary | ICD-10-CM

## 2021-03-10 DIAGNOSIS — R0600 Dyspnea, unspecified: Secondary | ICD-10-CM | POA: Insufficient documentation

## 2021-03-10 MED ORDER — PREDNISONE 50 MG PO TABS: 50.0000 mg | ORAL_TABLET | Freq: Every day | ORAL | 0 refills | Status: DC

## 2021-03-10 MED ORDER — ALBUTEROL SULFATE HFA 108 (90 BASE) MCG/ACT IN AERS
2.0000 | INHALATION_SPRAY | Freq: Once | RESPIRATORY_TRACT | Status: DC
  Filled 2021-03-10: qty 6.7

## 2021-03-10 NOTE — Discharge Instructions (Addendum)
You were seen in the emergency department today for trouble breathing.  Your chest x-ray was normal.  We are sending you home with inhaler use 1 to 2 puffs every 4-6 hours as needed for trouble breathing/wheezing.  We are also sending you home with prednisone which is a steroid to take to help with lung inflammation.  Please take this daily in the morning with breakfast.   We have prescribed you new medication(s) today. Discuss the medications prescribed today with your pharmacist as they can have adverse effects and interactions with your other medicines including over the counter and prescribed medications. Seek medical evaluation if you start to experience new or abnormal symptoms after taking one of these medicines, seek care immediately if you start to experience difficulty breathing, feeling of your throat closing, facial swelling, or rash as these could be indications of a more serious allergic reaction  Please follow-up with your primary care provider within 3 days.  Return to the emergency department for new or worsening symptoms including but not limited to new or worsening pain, increased trouble breathing, fever, coughing up blood, passing out, or any other concerns.
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
## 2021-03-12 ENCOUNTER — Telehealth: Payer: Self-pay

## 2021-03-12 NOTE — Telephone Encounter (Signed)
Called to notify pt to come in for ED f/u. Mobile phone went to [facility] ,spoke with pts mother asking if she could relay the message for her daughter to give [place] a call back to schedule an apt. Mother stated she would let her daughter know, being she went back to work morning of 03/12/2021.

## 2022-06-23 ENCOUNTER — Ambulatory Visit (HOSPITAL_COMMUNITY)
Admission: EM | Admit: 2022-06-23 | Discharge: 2022-06-23 | Disposition: A | Discharge status: Home / Self Care | Payer: Self-pay | Attending: Emergency Medicine | Admitting: Emergency Medicine

## 2022-06-23 DIAGNOSIS — K644 Residual hemorrhoidal skin tags: Secondary | ICD-10-CM

## 2022-06-23 MED ORDER — LIDOCAINE-HYDROCORT (PERIANAL) 3-0.5 % EX CREA: 1.0000 | TOPICAL_CREAM | Freq: Two times a day (BID) | CUTANEOUS | 0 refills | Status: AC

## 2022-06-23 NOTE — Discharge Instructions (Addendum)
Apply the cream up to two times daily  Make sure you are drinking lots of water Try a stool softener to keep the stool soft. Avoid straining and siting for long periods of time.  You can also continue epsom salt soaks

## 2022-06-23 NOTE — ED Triage Notes (Signed)
Pt is here for Hemorid [id number]

## 2022-06-23 NOTE — ED Provider Notes (Signed)
[facility]    CSN: [id number] Arrival date & time: 06/23/22  1049     History   Chief Complaint Chief Complaint  Patient presents with   Hemorid    HPI [name] is a 31 y.o. female.  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 Authorizing Provider  Lidocaine-Hydrocort, Perianal, (LIDOCORT) 3-0.5 % CREA Apply 1 Application topically in the morning and at bedtime for 5 days. 06/23/22 06/28/22 Yes Zakar Brosch, Lurena Joiner, PA-C  albuterol (PROVENTIL HFA;VENTOLIN HFA) 108 (90 Base) MCG/ACT inhaler Inhale 1-2 puffs into the lungs every 6 (six) hours as needed for wheezing or shortness of breath. Patient not taking: Reported on 02/18/2020 10/03/17   Wallis Bamberg, PA-C  cetirizine (ZYRTEC ALLERGY) 10 MG tablet Take 1 tablet (10 mg total) by mouth daily. 10/03/17   Wallis Bamberg, PA-C   fluticasone (FLONASE) 50 MCG/ACT nasal spray Place 1 spray into both nostrils daily. 01/10/20   Hall-Potvin, [place], PA-C  Magnesium 250 MG TABS Take 1 tablet by mouth with evening meal 02/18/20   Arnette Felts, FNP  Multiple Vitamins-Minerals (WOMENS MULTIVITAMIN PO) Take by mouth.    [provider]  predniSONE (DELTASONE) 50 MG tablet Take 1 tablet (50 mg total) by mouth daily with breakfast. 03/10/21   Petrucelli, Pleas Koch, PA-C    Family History Family History  Problem Relation Age of Onset   Heart disease Paternal Grandmother    Heart disease Maternal Grandmother    Diabetes Maternal Grandmother    Arthritis Father    Hypertension Father    Anemia Father    Arthritis Mother    Hypertension Mother     Social History Social History   Tobacco Use   Smoking status: Former    Packs/day: 0.10    Years: 2.00    Total pack years: 0.20    Types: Cigarettes   Smokeless tobacco: Never   Tobacco comments:    quit after found out preg  Substance Use Topics   Alcohol use: No  Comment: OCCASIONAL   Drug use: No     Allergies   Nickel and Other   Review of Systems Review of Systems  As per HPI  Physical Exam Triage Vital Signs ED Triage Vitals  Enc Vitals Group     BP 06/23/22 1241 (!) 148/87     Pulse Rate 06/23/22 1241 (!) 102     Resp 06/23/22 1241 16     Temp 06/23/22 1241 98.5 F (36.9 C)     Temp Source 06/23/22 1241 Oral     SpO2 06/23/22 1241 95 %     Weight --      Height --      Head Circumference --      Peak Flow --      Pain Score 06/23/22 1239 8     Pain Loc --      Pain Edu? --      Excl. in GC? --    No data found.  Updated Vital Signs BP (!) 148/87 (BP Location: Left Arm)   Pulse (!) 102   Temp 98.5 F (36.9 C) (Oral)   Resp 16   LMP 05/30/2022   SpO2 95%    Physical Exam Vitals and nursing note reviewed. Chaperone present: Psychologist, sport and exercise.  Constitutional:      Comments: Standing, appears uncomfortable   HENT:      Mouth/Throat:     Pharynx: Oropharynx is clear.  Cardiovascular:     Rate and Rhythm: Normal rate and regular rhythm.  Pulmonary:     Effort: Pulmonary effort is normal.  Abdominal:     Tenderness: There is no abdominal tenderness.  Genitourinary:    Rectum: External hemorrhoid present.     Comments: Small external hemorrhoid at 6 o'clock position. Tender. No bleeding. No fissure noted Neurological:     Mental Status: She is alert and oriented to person, place, and time.     UC Treatments / Results  Labs (all labs ordered are listed, but only abnormal results are displayed) Labs Reviewed - No data to display  EKG  Radiology No results found.  Procedures Procedures (including critical care time)  Medications Ordered in UC Medications - No data to display  Initial Impression / Assessment and Plan / UC Course  I have reviewed the triage vital signs and the nursing notes.  Pertinent labs & imaging results that were available during my care of the patient were reviewed by me and considered in my medical decision making (see chart for details).  Discussed cause and treatment Recommend Lidocort cream twice daily in combination with sitz bath, stool softener, fluids, tylenol/ibu Discussed regulating stool to avoid straining, avoid hard BM Return precautions discussed. Patient agrees to plan  Final Clinical Impressions(s) / UC Diagnoses   Final diagnoses:  External hemorrhoid     Discharge Instructions      Apply the cream up to two times daily  Make sure you are drinking lots of water Try a stool softener to keep the stool soft. Avoid straining and siting for long periods of time.  You can also continue epsom salt soaks    ED Prescriptions     Medication Sig Dispense Auth. Provider   Lidocaine-Hydrocort, Perianal, (LIDOCORT) 3-0.5 % CREA Apply 1 Application topically in the morning and at bedtime for 5 days. 28.3 g Jossiah Smoak, Lurena Joiner, PA-C      PDMP not  reviewed this encounter.   Marlow Baars, [place] 06/23/22 1358

## 2022-07-16 ENCOUNTER — Emergency Department (HOSPITAL_BASED_OUTPATIENT_CLINIC_OR_DEPARTMENT_OTHER): Payer: Self-pay

## 2022-07-16 ENCOUNTER — Emergency Department (HOSPITAL_BASED_OUTPATIENT_CLINIC_OR_DEPARTMENT_OTHER)
Admission: EM | Admit: 2022-07-16 | Discharge: 2022-07-16 | Disposition: A | Discharge status: Home / Self Care | Payer: Self-pay | Attending: Emergency Medicine | Admitting: Emergency Medicine

## 2022-07-16 ENCOUNTER — Other Ambulatory Visit: Payer: Self-pay

## 2022-07-16 ENCOUNTER — Encounter (HOSPITAL_BASED_OUTPATIENT_CLINIC_OR_DEPARTMENT_OTHER): Payer: Self-pay | Admitting: Emergency Medicine

## 2022-07-16 DIAGNOSIS — K6289 Other specified diseases of anus and rectum: Secondary | ICD-10-CM | POA: Insufficient documentation

## 2022-07-16 DIAGNOSIS — N9489 Other specified conditions associated with female genital organs and menstrual cycle: Secondary | ICD-10-CM | POA: Insufficient documentation

## 2022-07-16 LAB — BASIC METABOLIC PANEL
Anion gap: 10 (ref 5–15)
BUN: 12 mg/dL (ref 6–20)
CO2: 23 mmol/L (ref 22–32)
Calcium: 9.8 mg/dL (ref 8.9–10.3)
Chloride: 104 mmol/L (ref 98–111)
Creatinine, Ser: 0.7 mg/dL (ref 0.44–1.00)
GFR, Estimated: >60 mL/min (ref >60)
Glucose, Bld: 96 mg/dL (ref 70–99)
Potassium: 4.4 mmol/L (ref 3.5–5.1)
Sodium: 137 mmol/L (ref 135–145)

## 2022-07-16 LAB — CBC WITH DIFFERENTIAL/PLATELET
Abs Immature Granulocytes: 0.04 10*3/uL (ref 0.00–0.07)
Basophils Absolute: 0 10*3/uL (ref 0.0–0.1)
Basophils Relative: 0 %
Eosinophils Absolute: 0.1 10*3/uL (ref 0.0–0.5)
Eosinophils Relative: 1 %
HCT: 44 % (ref 36.0–46.0)
Hemoglobin: 14.1 g/dL (ref 12.0–15.0)
Immature Granulocytes: 1 %
Lymphocytes Relative: 30 %
Lymphs Abs: 2.5 10*3/uL (ref 0.7–4.0)
MCH: 26.1 pg (ref 26.0–34.0)
MCHC: 32 g/dL (ref 30.0–36.0)
MCV: 81.5 fL (ref 80.0–100.0)
Monocytes Absolute: 0.7 10*3/uL (ref 0.1–1.0)
Monocytes Relative: 9 %
Neutro Abs: 4.9 10*3/uL (ref 1.7–7.7)
Neutrophils Relative %: 59 %
Platelets: 322 10*3/uL (ref 150–400)
RBC: 5.4 MIL/uL — ABNORMAL HIGH (ref 3.87–5.11)
RDW: 14.7 % (ref 11.5–15.5)
WBC: 8.2 10*3/uL (ref 4.0–10.5)
nRBC: 0 % (ref 0.0–0.2)

## 2022-07-16 LAB — HCG, QUANTITATIVE, PREGNANCY
hCG, Beta Chain, Quant, S: <1050 m[IU]/mL — ABNORMAL HIGH (ref <5)
hCG, Beta Chain, Quant, S: <1 m[IU]/mL (ref <5)

## 2022-07-16 MED ORDER — DILTIAZEM GEL 2 %: 1.0000 | Freq: Two times a day (BID) | CUTANEOUS | 0 refills | Status: DC

## 2022-07-16 MED ORDER — PRAMOXINE HCL (PERIANAL) 1 % EX FOAM: 1.0000 | Freq: Three times a day (TID) | CUTANEOUS | 0 refills | Status: DC | PRN

## 2022-07-16 MED ORDER — TRAMADOL HCL 50 MG PO TABS
100.0000 mg | ORAL_TABLET | Freq: Once | ORAL | Status: AC
  Administered 2022-07-16: 100 mg via ORAL
  Filled 2022-07-16: qty 2

## 2022-07-16 MED ORDER — TRAMADOL HCL 50 MG PO TABS: ORAL_TABLET | ORAL | 0 refills | Status: DC

## 2022-07-16 MED ORDER — IOHEXOL 300 MG/ML  SOLN
100.0000 mL | Freq: Once | INTRAMUSCULAR | Status: AC | PRN
  Administered 2022-07-16: 100 mL via INTRAVENOUS

## 2022-07-16 NOTE — ED Notes (Signed)
Patient verbalizes understanding of discharge instructions. Opportunity for questioning and answers were provided. Patient discharged from ED.  °

## 2022-07-16 NOTE — ED Notes (Signed)
Pt thinks she has hemorrhoids. Upon exam with provider, there were no evident signs of hemorrhoids at site that patient was c/o hurting. Orders placed and will reassess as needed.

## 2022-07-16 NOTE — Discharge Instructions (Signed)
1.  Continue sitz bath and keeping the rectal area extremely clean and always clean with warm water after a bowel movement. 2.  You may use Proctofoam as prescribed for help with pain.  You may also use diltiazem gel twice daily to help with spasming pain in the anal area.  You may have to call several pharmacies to find where this is available.  This prescription has been printed so you can take it to a pharmacy that can dispense the medication. 3.  You have been given a prescription for tramadol.  Use this sparingly for more severe pain.  It may worsen constipation which will worsen hemorrhoids.  Take MiraLAX daily per package instructions, take over-the-counter Colace or other stool softener twice daily.  It is very important that you keep your bowel movement very soft and avoid all straining. 4.  Discuss referral to gastroenterology with your family doctor.  A referral has been placed through Epic but this does not always guarantee a follow-up with the specialist clinic when family physician referral is required.

## 2022-07-16 NOTE — ED Provider Notes (Signed)
[facility] EMERGENCY DEPT Provider Note   CSN: [id number] Arrival date & time: 07/16/22  9381     History  Chief Complaint  Patient presents with   Hemorrhoids    [name] is a 31 y.o. female.  HPI Patient presents with rectal pain.  States hemorrhoids were diagnosed a month ago.  Has had cream that she has been using it helped a little bit.  However continued pain.  States she had to miss some work last week due to it.  Worse with bowel movement.  Some blood with wiping.  Did have hemorrhoids withPregnancy.  Now worsening pain.  No fevers.   Past Medical History:  Diagnosis Date   Anemia     Home Medications Prior to Admission medications   Medication Sig Start Date End Date Taking? Authorizing Provider  albuterol (PROVENTIL HFA;VENTOLIN HFA) 108 (90 Base) MCG/ACT inhaler Inhale 1-2 puffs into the lungs every 6 (six) hours as needed for wheezing or shortness of breath. Patient not taking: Reported on 02/18/2020 10/03/17   Wallis Bamberg, PA-C  cetirizine (ZYRTEC ALLERGY) 10 MG tablet Take 1 tablet (10 mg total) by mouth daily. 10/03/17   Wallis Bamberg, PA-C  fluticasone (FLONASE) 50 MCG/ACT nasal spray Place 1 spray into both nostrils daily. 01/10/20   Hall-Potvin, [place], PA-C  Magnesium 250 MG TABS Take 1 tablet by mouth with evening meal 02/18/20   Arnette Felts, FNP  Multiple Vitamins-Minerals (WOMENS MULTIVITAMIN PO) Take by mouth.    [provider]  predniSONE (DELTASONE) 50 MG tablet Take 1 tablet (50 mg total) by mouth daily with breakfast. 03/10/21   Petrucelli, Samantha R, PA-C      Allergies    Nickel and Other    Review of Systems   Review of Systems  Physical Exam Updated Vital Signs BP 134/87   Pulse 71   Temp 98.1 F (36.7 C)   Resp 17   Wt 117.9 kg   LMP 06/29/2022 (Approximate)   SpO2 98%   BMI 44.63 kg/m  Physical Exam Vitals and nursing note reviewed.  Eyes:     Pupils: Pupils are equal, round, and reactive to  light.  Cardiovascular:     Rate and Rhythm: Regular rhythm.  Pulmonary:     Breath sounds: No wheezing.  Abdominal:     Tenderness: There is no abdominal tenderness.  Genitourinary:    Comments: No large external hemorrhoids seen.  Does have posterior tenderness.  Somewhat difficult exam but no clear fissure seen. Musculoskeletal:        General: Tenderness present.     Cervical back: Neck supple.  Skin:    Capillary Refill: Capillary refill takes less than 2 seconds.  Neurological:     Mental Status: She is alert and oriented to person, place, and time.     ED Results / Procedures / Treatments   Labs (all labs ordered are listed, but only abnormal results are displayed) Labs Reviewed  CBC WITH DIFFERENTIAL/PLATELET - Abnormal; Notable for the following components:      Result Value   RBC 5.40 (*)    All other components within normal limits  HCG, QUANTITATIVE, PREGNANCY - Abnormal; Notable for the following components:   hCG, Beta Chain, Quant, S <1,050 (*)    All other components within normal limits  BASIC METABOLIC PANEL  HCG, QUANTITATIVE, PREGNANCY  PREGNANCY, URINE    EKG None  Radiology No results found.  Procedures Procedures    Medications Ordered in ED Medications  iohexol (OMNIPAQUE) 300 MG/ML solution 100 mL (has no administration in time range)    ED Course/ Medical Decision Making/ A&P                           Medical Decision Making Amount and/or Complexity of Data Reviewed Labs: ordered. Radiology: ordered.   Patient with rectal pain.  Has had for better part of a month now.  No relief with conservative treatment.  Has been using sitz bath's.  Differential diagnosis includes hemorrhoids, fissure, abscess, rectal pain.  No clear hemorrhoids seen.  Does have mild fullness in the area but not a clear hemorrhoid.  With tenderness and inability to tolerate rectal exam will get CT scan to further evaluate.  Also basic blood work.   Blood work  reassuring.  Not pregnant.  Will get CT scan to evaluate for other causes such as abscess.  If not found likely can follow-up with GI follow-up and pain relief.  Care turned over Dr. Clarice Pole        Final Clinical Impression(s) / ED Diagnoses Final diagnoses:  None    Rx / DC Orders ED Discharge Orders     None         Benjiman Core, MD 07/16/22 1544

## 2022-07-16 NOTE — ED Provider Notes (Signed)
CT scan for rectal pain. GI F/U.  Physical Exam  BP 134/87   Pulse 71   Temp 98.1 F (36.7 C)   Resp 17   Wt 117.9 kg   LMP 06/29/2022 (Approximate)   SpO2 98%   BMI 44.63 kg/m   Physical Exam  Procedures  Procedures  ED Course / MDM    Medical Decision Making Amount and/or Complexity of Data Reviewed Labs: ordered. Radiology: ordered.  Risk OTC drugs. Prescription drug management.   CT scan reviewed by radiology does not show acute findings.  I have also visually reviewed the scan and did not identify any significant stool in the rectum or stool impaction.  Patient does not appear to have significant constipation by CT imaging.  I have reviewed a plan for home care with the patient.  She will continue her sitz bath.  I prescribed for Proctofoam and diltiazem gel.  We have discussed management of constipation.  We discussed the importance of keeping a bowel movement very soft.  I have recommended daily MiraLAX and twice daily Colace.  Due to severe pain, I have prescribed some tramadol.  Patient is aware that she must use this sparingly not to exacerbate constipation.  They have requested a referral to [facility] gastroenterology.  I have placed an ambulatory referral but have also stressed that the family physician may also have to be involved for this type of referral.  Patient is discharged in stable condition.       Arby Barrette, MD 07/16/22 [phone]

## 2022-07-16 NOTE — ED Triage Notes (Signed)
Presents for pain preventing her from sitting related to hemorrhoids that were diagnosed 1 moth ago and for which she was seen at an UC 2 weeks ago. Currently using a topical steroid cream and tylenol with no relief. She is also taking stool softeners  Endorses blood from rectum when wiping. Pain worse with BM.

## 2022-07-21 ENCOUNTER — Ambulatory Visit (INDEPENDENT_AMBULATORY_CARE_PROVIDER_SITE_OTHER): Payer: Self-pay | Admitting: Internal Medicine

## 2022-07-21 ENCOUNTER — Encounter: Payer: Self-pay | Admitting: Internal Medicine

## 2022-07-21 VITALS — BP 140/76 | HR 106 | Ht 64.0 in | Wt 265.0 lb

## 2022-07-21 DIAGNOSIS — K6289 Other specified diseases of anus and rectum: Secondary | ICD-10-CM

## 2022-07-21 DIAGNOSIS — K602 Anal fissure, unspecified: Secondary | ICD-10-CM

## 2022-07-21 MED ORDER — DILTIAZEM GEL 2 %: 1.0000 | Freq: Every day | CUTANEOUS | 2 refills | Status: DC

## 2022-07-21 NOTE — Progress Notes (Signed)
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  Patient reports passing a hard difficult stool in early December.  Associated with that with pain and rectal bleeding.  Thereafter she has noticed pain with defecation and moderate blood on the tissue with wiping.  The pain has been quite severe at times.  She has been afraid to move her bowels due to the pain.  Recently she has been using stool softeners and MiraLAX to assist with bowel movements.  She was evaluated in the emergency room July 16, 2022 regarding the same.  Reviewed.  Blood work at that time was unremarkable including hemoglobin which was normal at 14.1.  They did order a CT scan of the abdomen and pelvis with contrast regarding the rectal pain.  There were no acute abnormalities.  There were no obvious perirectal abnormalities.  She was diagnosed with anal fissure and prescribed diltiazem gel, Proctofoam, and Ultram.  She did take the Ultram which helped the pain.  However, this was constipating.  She did not fill the diltiazem gel, but her mother, who accompanies her today, states that her husband had the same compound which the patient has tried sparingly.  GI review of systems is otherwise negative.  She is concerned about lifting.  She wonders if this may exacerbate the problem.  She thought that she had hemorrhoids.  REVIEW OF SYSTEMS:  All non-GI ROS negative unless otherwise stated in the HPI   Past Medical History:  Diagnosis Date   Anemia    Obesity     Past Surgical History:  Procedure Laterality Date   CHOLECYSTECTOMY     ELBOW FRACTURE SURGERY     BROKEN HUMEROUS   WISDOM TOOTH EXTRACTION      Social History [name]  reports that she has quit smoking. Her smoking use included cigarettes. She has a 0.20 pack-year smoking history. She has never used smokeless tobacco. She reports that she does not drink alcohol and  does not use drugs.  family history includes Anemia in her father; Arthritis in her father and mother; Diabetes in her maternal grandmother; Heart disease in her maternal grandmother and paternal grandmother; Hypertension in her father and mother.  Allergies  Allergen Reactions   Nickel Hives, Itching and Rash   Other Hives, Itching and Rash    PT IS ALLERGIC TO GRASS       PHYSICAL EXAMINATION: Vital signs: BP (!) 140/76   Pulse (!) 106   Ht 5\' 4"  (1.626 m)   Wt 265 lb (120.2 kg)   LMP 06/29/2022 (Approximate)   BMI 45.49 kg/m   Constitutional: generally well-appearing, no acute distress Psychiatric: alert and oriented x 3, cooperative Eyes: Anicteric Cardiovascular: heart regular rate and rhythm, no murmur Lungs: clear to auscultation bilaterally Abdomen: soft, obese, nontender, nondistended, no obvious ascites, no peritoneal signs, normal bowel sounds, no organomegaly Rectal: No external abnormalities.  Tender posterior fissure.  Brown stool Extremities: no clubbing, cyanosis, or lower extremity edema bilaterally Skin: no lesions on visible extremities Neuro: No focal deficits.  Cranial nerves intact  ASSESSMENT:  1.  Anal fissure   PLAN:  1.  Metamucil 2 tablespoons daily 2.  Sitz bathes daily 3.  Prescribe 2% diltiazem gel/ointment.  Apply 5 times daily for 6 weeks.  Personally instructed on how to properly apply the medication. 4.  If the problem persists after 6 to 8 weeks  of therapy, can consider referral to colorectal surgeon for lateral sphincterotomy. A total time of 45 minutes was spent preparing to see the patient, reviewing outside x-rays, laboratories, and evaluations.  Obtaining comprehensive history, performing medically appropriate physical examination, counseling and educating the patient and her mother regarding her above listed issues, ordering medication, and documenting clinical information in the health record.

## 2022-07-21 NOTE — Patient Instructions (Signed)
_______________________________________________________  If you are age 31 or older, your body mass index should be between 23-30. Your Body mass index is 45.49 kg/m. If this is out of the aforementioned range listed, please consider follow up with your Primary Care Provider.  If you are age 67 or younger, your body mass index should be between 19-25. Your Body mass index is 45.49 kg/m. If this is out of the aformentioned range listed, please consider follow up with your Primary Care Provider.   ________________________________________________________  The [place] [facility] providers would like to encourage you to use [facility] to communicate with providers for non-urgent requests or questions.  Due to long hold times on the telephone, sending your provider a message by [facility] may be a faster and more efficient way to get a response.  Please allow 48 business hours for a response.  Please remember that this is for non-urgent requests.  _______________________________________________________  We have sent a prescription for Diltiazem gel to [facility]. You should apply a pea size amount to your rectum three times daily x 6-8 weeks.  [facility]'s information is below: Address: [address] [place], [place] [address]  [phone]  *Please DO NOT go directly from our office to pick up this medication! Give the pharmacy 1 day to process the prescription as this is compounded and takes time to make.  Sitz baths  Metamucil daily

## 2022-07-23 ENCOUNTER — Telehealth: Payer: Self-pay

## 2022-07-23 NOTE — Telephone Encounter (Signed)
Patient called to state her Diltiazem wasn't at [facility].  I called [place] and gave the prescription request verbally.

## 2022-08-06 ENCOUNTER — Telehealth: Payer: Self-pay | Admitting: Internal Medicine

## 2022-08-06 MED ORDER — LIDOCAINE 5 % EX OINT: 1.0000 | TOPICAL_OINTMENT | CUTANEOUS | 0 refills | Status: DC | PRN

## 2022-08-06 NOTE — Telephone Encounter (Signed)
See my office note. Recommend diltiazem ointment 5 times daily, fiber daily, and sitz bath's. You can can also prescribe 5% lidocaine ointment to apply to the painful area.

## 2022-08-06 NOTE — Telephone Encounter (Signed)
Pt states that the feeling of being cut with glass when she has a BM is getting better so she thinks she is healing. She is however having problems with rectal spasms and wants to know if there is anything Dr. Henrene Pastor can recommend to help with the spasms. Reports they are becoming unbearable. Please advise.

## 2022-08-06 NOTE — Telephone Encounter (Signed)
Left detailed voicemail for pt regarding Dr. Blanch Media recommendations. Script sent to pharmacy.

## 2022-08-06 NOTE — Telephone Encounter (Signed)
PT mother is calling about about anal fissure. She keeps having spasms that last 20-45 minutes. Looking to see if there are any options to help with this. Please advise.

## 2023-06-19 DIAGNOSIS — Z419 Encounter for procedure for purposes other than remedying health state, unspecified: Secondary | ICD-10-CM | POA: Diagnosis not present

## 2023-07-20 DIAGNOSIS — Z419 Encounter for procedure for purposes other than remedying health state, unspecified: Secondary | ICD-10-CM | POA: Diagnosis not present

## 2023-08-14 ENCOUNTER — Ambulatory Visit
Admission: EM | Admit: 2023-08-14 | Discharge: 2023-08-14 | Disposition: A | Discharge status: Home / Self Care | Payer: Medicaid Other | Attending: Physician Assistant | Admitting: Physician Assistant

## 2023-08-14 ENCOUNTER — Other Ambulatory Visit: Payer: Self-pay

## 2023-08-14 ENCOUNTER — Encounter: Payer: Self-pay | Admitting: *Deleted

## 2023-08-14 DIAGNOSIS — J101 Influenza due to other identified influenza virus with other respiratory manifestations: Secondary | ICD-10-CM | POA: Diagnosis not present

## 2023-08-14 LAB — POCT INFLUENZA A/B
Influenza A, POC: POSITIVE — AB
Influenza B, POC: NEGATIVE

## 2023-08-14 MED ORDER — OSELTAMIVIR PHOSPHATE 75 MG PO CAPS: 75.0000 mg | ORAL_CAPSULE | Freq: Two times a day (BID) | ORAL | 0 refills | Status: DC

## 2023-08-14 NOTE — ED Provider Notes (Signed)
[name]    CSN: [id number] Arrival date & time: 08/14/23  0804      History   Chief Complaint Chief Complaint  Patient presents with   Fever    HPI [name] is a 33 y.o. female.   Patient here today for eval of fever, chills, nausea, vomiting and diarrhea that started 2 days ago.  Her son also has the flu currently.  She has been taking DayQuil, NyQuil and Delsym without resolution.  She notes Tmax of 102.  The history is provided by the patient.  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 Authorizing Provider  oseltamivir (TAMIFLU) 75 MG capsule Take 1 capsule (75 mg total) by mouth every 12 (twelve) hours. 08/14/23  Yes Tomi Bamberger, PA-C  albuterol (PROVENTIL HFA;VENTOLIN HFA) 108 (90 Base) MCG/ACT inhaler Inhale 1-2 puffs into the lungs every 6 (six) hours as needed for wheezing or shortness of breath. Patient not taking: Reported on 02/18/2020 10/03/17   Wallis Bamberg, PA-C   ascorbic acid (VITAMIN C) 500 MG tablet Take 500 mg by mouth daily.    [provider]  cetirizine (ZYRTEC ALLERGY) 10 MG tablet Take 1 tablet (10 mg total) by mouth daily. Patient not taking: Reported on 07/21/2022 10/03/17   Wallis Bamberg, PA-C  diltiazem 2 % GEL Apply 1 Application topically 2 (two) times daily. 07/16/22   Arby Barrette, MD  diltiazem 2 % GEL Apply 1 Application topically 5 (five) times daily. 07/21/22   Hilarie Fredrickson, MD  fluticasone [facility]) 50 MCG/ACT nasal spray Place 1 spray into both nostrils daily. Patient not taking: Reported on 07/21/2022 01/10/20   Hall-Potvin, [place], PA-C  lidocaine (XYLOCAINE) 5 % ointment Apply 1 Application topically as needed. 08/06/22   Hilarie Fredrickson, MD  Magnesium 250 MG TABS Take 1 tablet by mouth with evening meal 02/18/20   Arnette Felts, FNP  Multiple Vitamins-Minerals (WOMENS MULTIVITAMIN PO) Take by mouth.    [provider]  pramoxine (PROCTOFOAM) 1 % foam Place 1 Application rectally 3 (three) times daily as needed for anal itching. Patient not taking: Reported on 07/21/2022 07/16/22   Arby Barrette, MD  predniSONE (DELTASONE) 50 MG tablet Take 1 tablet (50 mg total) by mouth daily with breakfast. 03/10/21   Petrucelli, Pleas Koch, PA-C  traMADol (ULTRAM) 50 MG tablet 1-2 tablets every 6 hours as needed for pain 07/16/22   Arby Barrette, MD    Family History Family History  Problem Relation Age of Onset   Arthritis Mother    Hypertension Mother    Arthritis Father    Hypertension Father    Anemia Father    Heart disease Maternal Grandmother    Diabetes Maternal Grandmother    Heart disease Paternal Grandmother    Colon cancer Neg Hx    Stomach cancer Neg Hx    Esophageal cancer Neg Hx     Social History Social History   Tobacco Use   Smoking status: Former    Current packs/day: 0.10    Average packs/day: 0.1 packs/day for 2.0 years (0.2 ttl pk-yrs)    Types: Cigarettes   Smokeless tobacco: Never    Tobacco comments:    quit after found out preg  Vaping Use   Vaping status: Never Used  Substance Use Topics   Alcohol use: Not Currently    Comment: OCCASIONAL   Drug use: No     Allergies   Nickel and Other   Review of Systems Review of Systems  Constitutional:  Positive for chills and fever.  HENT:  Positive for congestion and sore throat. Negative for ear pain.   Eyes:  Negative for discharge and redness.  Respiratory:  Positive for cough. Negative for shortness of breath and wheezing.   Gastrointestinal:  Positive for diarrhea, nausea and vomiting. Negative for abdominal pain.  Musculoskeletal:  Positive for myalgias.     Physical Exam Triage Vital Signs ED Triage Vitals  Encounter Vitals Group     BP 08/14/23 0826 121/80     Systolic BP Percentile --      Diastolic BP Percentile --      Pulse Rate 08/14/23 0826 95     Resp 08/14/23 0826 18     Temp 08/14/23 0826 99.3 F (37.4 C)     Temp Source 08/14/23 0826 Oral     SpO2 08/14/23 0826 96 %     Weight --      Height --      Head Circumference --      Peak Flow --      Pain Score 08/14/23 0823 8     Pain Loc --      Pain Education --      Exclude from Growth Chart --    No data found.  Updated Vital Signs BP 121/80 (BP Location: Left Arm)   Pulse 95   Temp 99.3 F (37.4 C) (Oral)   Resp 18   LMP 07/16/2023   SpO2 96%   Visual Acuity Right Eye Distance:   Left Eye Distance:   Bilateral Distance:    Right Eye Near:   Left Eye Near:    Bilateral Near:     Physical Exam Vitals and nursing note reviewed.  Constitutional:      General: She is not in acute distress.    Appearance: Normal appearance. She is not ill-appearing.  HENT:     Head: Normocephalic and atraumatic.     Nose: Congestion present.     Mouth/Throat:     Mouth: Mucous membranes are moist.     Pharynx: No oropharyngeal exudate or posterior oropharyngeal erythema.  Eyes:     Conjunctiva/sclera:  Conjunctivae normal.   Cardiovascular:     Rate and Rhythm: Normal rate and regular rhythm.     Heart sounds: Normal heart sounds. No murmur heard. Pulmonary:     Effort: Pulmonary effort is normal. No respiratory distress.     Breath sounds: Normal breath sounds. No wheezing, rhonchi or rales.  Skin:    General: Skin is warm and dry.  Neurological:     Mental Status: She is alert.  Psychiatric:        Mood and Affect: Mood normal.        Thought Content: Thought content normal.      UC Treatments / Results  Labs (all labs ordered are listed, but only abnormal results are displayed) Labs Reviewed  POCT INFLUENZA A/B - Abnormal; Notable for the following components:      Result Value   Influenza A, POC Positive (*)    All other components within normal limits    EKG   Radiology No results found.  Procedures Procedures (including critical care time)  Medications Ordered in UC Medications - No data to display  Initial Impression / Assessment and Plan / UC Course  I have reviewed the triage vital signs and the nursing notes.  Pertinent labs & imaging results that were available during my care of the patient were reviewed by me and considered in my medical decision making (see chart for details).    Flu screening positive in office.  Will treat with Tamiflu and recommended increase fluids and rest with symptomatic treatment.  Advised follow-up if no gradual improvement with any further concerns.  Final Clinical Impressions(s) / UC Diagnoses   Final diagnoses:  Influenza A   Discharge Instructions   None    ED Prescriptions     Medication Sig Dispense Auth. Provider   oseltamivir (TAMIFLU) 75 MG capsule Take 1 capsule (75 mg total) by mouth every 12 (twelve) hours. 10 capsule Tomi Bamberger, PA-C      PDMP not reviewed this encounter.   Tomi Bamberger, PA-C 08/14/23 [phone]

## 2023-08-14 NOTE — ED Triage Notes (Signed)
Fever, chills, n/v/d, since Friday. Son has the flu. Taking OTC meds

## 2023-08-17 ENCOUNTER — Ambulatory Visit
Admission: EM | Admit: 2023-08-17 | Discharge: 2023-08-17 | Disposition: A | Discharge status: Home / Self Care | Payer: Medicaid Other | Attending: Physician Assistant | Admitting: Physician Assistant

## 2023-08-17 ENCOUNTER — Encounter: Payer: Self-pay | Admitting: Emergency Medicine

## 2023-08-17 DIAGNOSIS — J029 Acute pharyngitis, unspecified: Secondary | ICD-10-CM

## 2023-08-17 DIAGNOSIS — J111 Influenza due to unidentified influenza virus with other respiratory manifestations: Secondary | ICD-10-CM

## 2023-08-17 LAB — POCT RAPID STREP A (OFFICE): Rapid Strep A Screen: NEGATIVE

## 2023-08-17 NOTE — ED Triage Notes (Signed)
Pt reports sore throat that started last night. Pt reports severe pain and dysphagia. Pt is hoarse and can barely talk due to pain. She was seen at UC 3 days ago and tested positive flu A. Pt has been taking tamiflu, but still not feeling better and this sore throat is a new symptom.

## 2023-08-17 NOTE — ED Provider Notes (Signed)
[name]    CSN: [id number] Arrival date & time: 08/17/23  1703      History   Chief Complaint Chief Complaint  Patient presents with   Sore Throat    HPI [name] is a 33 y.o. female.   Patient complains of a sore throat.  Patient was diagnosed with influenza a 3 days ago.  Patient reports she has been taking Tamiflu without relief.  Patient complains of a sore throat today.  Patient feels like she has swelling in her throat.  Patient reports the top of her mouth feels raw.  The history is provided by the patient. No language interpreter was used.  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 Authorizing Provider  albuterol (PROVENTIL HFA;VENTOLIN HFA) 108 (90 Base) MCG/ACT inhaler Inhale 1-2 puffs into the lungs every 6 (six) hours as needed for wheezing or shortness of breath. 10/03/17  Yes Wallis Bamberg, PA-C  ascorbic acid (VITAMIN C) 500 MG tablet Take 500 mg by mouth daily.   Yes [provider]  Magnesium 250 MG TABS Take 1 tablet by mouth with evening meal 02/18/20  Yes Arnette Felts, FNP  oseltamivir  (TAMIFLU) 75 MG capsule Take 1 capsule (75 mg total) by mouth every 12 (twelve) hours. 08/14/23  Yes Tomi Bamberger, PA-C  cetirizine (ZYRTEC ALLERGY) 10 MG tablet Take 1 tablet (10 mg total) by mouth daily. Patient not taking: Reported on 07/21/2022 10/03/17   Wallis Bamberg, PA-C  diltiazem 2 % GEL Apply 1 Application topically 2 (two) times daily. Patient not taking: Reported on 08/17/2023 07/16/22   Arby Barrette, MD  diltiazem 2 % GEL Apply 1 Application topically 5 (five) times daily. 07/21/22   Hilarie Fredrickson, MD  fluticasone [facility]) 50 MCG/ACT nasal spray Place 1 spray into both nostrils daily. Patient not taking: Reported on 07/21/2022 01/10/20   Hall-Potvin, [place], PA-C  lidocaine (XYLOCAINE) 5 % ointment Apply 1 Application topically as needed. Patient not taking: Reported on 08/17/2023 08/06/22   Hilarie Fredrickson, MD  Multiple Vitamins-Minerals (WOMENS MULTIVITAMIN PO) Take by mouth.    [provider]  pramoxine (PROCTOFOAM) 1 % foam Place 1 Application rectally 3 (three) times daily as needed for anal itching. Patient not taking: Reported on 07/21/2022 07/16/22  Arby Barrette, MD  predniSONE (DELTASONE) 50 MG tablet Take 1 tablet (50 mg total) by mouth daily with breakfast. Patient not taking: Reported on 08/17/2023 03/10/21   Petrucelli, Pleas Koch, PA-C  traMADol (ULTRAM) 50 MG tablet 1-2 tablets every 6 hours as needed for pain Patient not taking: Reported on 08/17/2023 07/16/22   Arby Barrette, MD    Family History Family History  Problem Relation Age of Onset   Arthritis Mother    Hypertension Mother    Arthritis Father    Hypertension Father    Anemia Father    Heart disease Maternal Grandmother    Diabetes Maternal Grandmother    Heart disease Paternal Grandmother    Colon cancer Neg Hx    Stomach cancer Neg Hx    Esophageal cancer Neg Hx     Social History Social History   Tobacco Use   Smoking status: Former    Current packs/day: 0.10    Average  packs/day: 0.1 packs/day for 2.0 years (0.2 ttl pk-yrs)    Types: Cigarettes   Smokeless tobacco: Never   Tobacco comments:    quit after found out preg  Vaping Use   Vaping status: Never Used  Substance Use Topics   Alcohol use: Not Currently    Comment: OCCASIONAL   Drug use: No     Allergies   Nickel and Other   Review of Systems Review of Systems  HENT:  Positive for sore throat.   All other systems reviewed and are negative.    Physical Exam Triage Vital Signs ED Triage Vitals  Encounter Vitals Group     BP 08/17/23 1744 (!) 130/91     Systolic BP Percentile --      Diastolic BP Percentile --      Pulse Rate 08/17/23 1744 79     Resp 08/17/23 1744 18     Temp 08/17/23 1744 98.4 F (36.9 C)     Temp Source 08/17/23 1744 Oral     SpO2 08/17/23 1744 97 %     Weight --      Height --      Head Circumference --      Peak Flow --      Pain Score 08/17/23 1746 10     Pain Loc --      Pain Education --      Exclude from Growth Chart --    No data found.  Updated Vital Signs BP (!) 130/91 (BP Location: Left Arm)   Pulse 79   Temp 98.4 F (36.9 C) (Oral)   Resp 18   LMP 07/16/2023   SpO2 97%   Visual Acuity Right Eye Distance:   Left Eye Distance:   Bilateral Distance:    Right Eye Near:   Left Eye Near:    Bilateral Near:     Physical Exam Vitals reviewed.  HENT:     Mouth/Throat:     Pharynx: Posterior oropharyngeal erythema present.  Cardiovascular:     Rate and Rhythm: Normal rate.     Heart sounds: Normal heart sounds.  Abdominal:     Palpations: Abdomen is soft.  Skin:    General: Skin is warm.  Neurological:     Mental Status: She is alert.  Psychiatric:        Mood and Affect: Mood normal.      UC Treatments / Results  Labs (all labs ordered are listed, but only abnormal results are displayed) Labs Reviewed  POCT RAPID  STREP A (OFFICE) - Normal  Strep screen is negative   EKG   Radiology No results  found.  Procedures Procedures (including critical care time)  Medications Ordered in UC Medications - No data to display  Initial Impression / Assessment and Plan / UC Course  I have reviewed the triage vital signs and the nursing notes.  Pertinent labs & imaging results that were available during my care of the patient were reviewed by me and considered in my medical decision making (see chart for details).      Final Clinical Impressions(s) / UC Diagnoses   Final diagnoses:  Influenza  Acute pharyngitis, unspecified etiology   Discharge Instructions   None    ED Prescriptions   None    PDMP not reviewed this encounter. An After Visit Summary was printed and given to the patient.       Elson Areas, [place] 08/17/23 1859

## 2023-08-19 ENCOUNTER — Emergency Department (HOSPITAL_BASED_OUTPATIENT_CLINIC_OR_DEPARTMENT_OTHER)
Admission: EM | Admit: 2023-08-19 | Discharge: 2023-08-19 | Disposition: A | Discharge status: Home / Self Care | Payer: Medicaid Other

## 2023-08-19 ENCOUNTER — Other Ambulatory Visit: Payer: Self-pay

## 2023-08-19 ENCOUNTER — Encounter (HOSPITAL_BASED_OUTPATIENT_CLINIC_OR_DEPARTMENT_OTHER): Payer: Self-pay

## 2023-08-19 DIAGNOSIS — R052 Subacute cough: Secondary | ICD-10-CM

## 2023-08-19 DIAGNOSIS — J028 Acute pharyngitis due to other specified organisms: Secondary | ICD-10-CM | POA: Diagnosis not present

## 2023-08-19 DIAGNOSIS — J029 Acute pharyngitis, unspecified: Secondary | ICD-10-CM

## 2023-08-19 DIAGNOSIS — B9789 Other viral agents as the cause of diseases classified elsewhere: Secondary | ICD-10-CM | POA: Diagnosis not present

## 2023-08-19 LAB — GROUP A STREP BY PCR: Group A Strep by PCR: NOT DETECTED

## 2023-08-19 MED ORDER — PREDNISONE 50 MG PO TABS: ORAL_TABLET | ORAL | 0 refills | Status: DC

## 2023-08-19 NOTE — ED Provider Notes (Signed)
[place] EMERGENCY DEPARTMENT AT [facility] Provider Note   CSN: [id number] Arrival date & time: 08/19/23  1058     History  Chief Complaint  Patient presents with   Sore Throat    [name] is a 33 y.o. female otherwise healthy presents with complaints of sore throat and difficulty swallowing and persistent cough.  Denies any difficulty breathing.  Reports poor p.o. intake because of symptoms.  Patient states she was diagnosed with the flu on Sunday.  She has been compliant with Tamiflu, has 2 days remaining.  Although she reports no improvement of her symptoms. Patient presented to urgent care 3 days ago 1/29 for similar complaints.  Rapid strep test was negative.  Patient discharged with presumed diagnosis of pharyngitis.  Sore Throat       Home Medications Prior to Admission medications   Medication Sig Start Date End Date Taking? Authorizing Provider  predniSONE (DELTASONE) 50 MG tablet Please take 1 tablet daily 08/19/23  Yes Halford Decamp, PA-C  albuterol (PROVENTIL HFA;VENTOLIN HFA) 108 (90 Base) MCG/ACT inhaler Inhale 1-2 puffs into the lungs every 6 (six) hours as needed for wheezing or shortness of breath. 10/03/17   Wallis Bamberg, PA-C  ascorbic acid (VITAMIN C) 500 MG tablet Take 500 mg by mouth daily.    [provider]  cetirizine (ZYRTEC ALLERGY) 10 MG tablet Take 1 tablet (10 mg total) by mouth daily. Patient not taking: Reported on 07/21/2022 10/03/17   Wallis Bamberg, PA-C  Magnesium 250 MG TABS Take 1 tablet by mouth with evening meal 02/18/20   Arnette Felts, FNP  Multiple Vitamins-Minerals (WOMENS MULTIVITAMIN PO) Take by mouth.    [provider]  oseltamivir (TAMIFLU) 75 MG capsule Take 1 capsule (75 mg total) by mouth every 12 (twelve) hours. 08/14/23   Tomi Bamberger, PA-C      Allergies    Nickel and Other    Review of Systems   Review of Systems  HENT:  Positive for sore throat.   Respiratory:  Positive for cough.      Physical Exam Updated Vital Signs BP (!) 148/89 (BP Location: Right Arm)   Pulse 66   Temp 98.6 F (37 C)   Resp 18   Ht 5\' 4"  (1.626 m)   Wt 117.9 kg   LMP 08/19/2023 (Exact Date)   SpO2 99%   BMI 44.63 kg/m  Physical Exam Vitals and nursing note reviewed.  Constitutional:      General: She is not in acute distress.    Appearance: She is well-developed.  HENT:     Head: Normocephalic and atraumatic.     Comments: No peritonsillar abscess visualized, uvula is midline, no exudate or pharyngeal erythema, no dental tenderness or abscess noted, no pooling of secretions or change in phonation, no pain with tongue protrusion Eyes:     Conjunctiva/sclera: Conjunctivae normal.  Cardiovascular:     Rate and Rhythm: Normal rate and regular rhythm.     Heart sounds: No murmur heard. Pulmonary:     Effort: Pulmonary effort is normal. No respiratory distress.     Breath sounds: Normal breath sounds.  Abdominal:     Palpations: Abdomen is soft.     Tenderness: There is no abdominal tenderness.  Musculoskeletal:        General: No swelling.     Cervical back: Neck supple.  Skin:    General: Skin is warm and dry.     Capillary Refill: Capillary refill takes  less than 2 seconds.  Neurological:     Mental Status: She is alert.  Psychiatric:        Mood and Affect: Mood normal.     ED Results / Procedures / Treatments   Labs (all labs ordered are listed, but only abnormal results are displayed) Labs Reviewed  GROUP A STREP BY PCR    EKG None  Radiology No results found.  Procedures Procedures    Medications Ordered in ED Medications - No data to display  ED Course/ Medical Decision Making/ A&P                                 Medical Decision Making  This patient presents to the ED with chief complaint(s) of sore throat, flulike symptoms.  The complaint involves an extensive differential diagnosis and also carries with it a high risk of complications and  morbidity.   pertinent past medical history as listed in HPI  The differential diagnosis includes  viral illness, pharyngitis, mono, sinusitis,AOM, pneumonia, peritonsillar abscess or retropharyngeal pharyngeal abscess, dental abscess, bronchitis The initial plan is to  Obtain respiratory panel Additional history obtained: Additional history obtained from family Records reviewed previous admission documents and Care Everywhere/External Records  Initial Assessment:   Nontoxic-appearing patient presenting with sore throat and flulike symptoms.  I have a low suspicion for pneumonia as lung sounds are clear and patient is afebrile.  No exudate or significant cervical lymphadenopathy on exam to suggest strep or mono.  No peritonsillar abscess visualized.  Patient is hemodynamically stable and not requiring oxygen.  Overall this is her second visit for similar complaints without any improvement.  She does have a persistent nonproductive cough of which she could have a component of bronchitis.  She was diagnosed with viral pharyngitis at the urgent care.  Given persistent cough despite Tamiflu and difficulty swallowing will send in short course of steroids.  Independent ECG interpretation:  None   Independent labs interpretation:  The following labs were independently interpreted:  Strep test negative  Independent visualization and interpretation of imaging: None Treatment and Reassessment: No medications administered during visit   Consultations obtained:   None   Disposition:   Patient will be discharged home.  Provided short course of steroids.  Educated on supportive care.  Encouraged to follow-up with primary care provider should her symptoms persist. The patient has been appropriately medically screened and/or stabilized in the ED. I have low suspicion for any other emergent medical condition which would require further screening, evaluation or treatment in the ED or require inpatient  management. At time of discharge the patient is hemodynamically stable and in no acute distress. I have discussed work-up results and diagnosis with patient and answered all questions. Patient is agreeable with discharge plan. We discussed strict return precautions for returning to the emergency department and they verbalized understanding.     Social Determinants of Health:   Patient's impaired access to primary care  increases the complexity of managing their presentation  This note was dictated with voice recognition software.  Despite best efforts at proofreading, errors may have occurred which can change the documentation meaning.          Final Clinical Impression(s) / ED Diagnoses Final diagnoses:  Viral pharyngitis  Subacute cough    Rx / DC Orders ED Discharge Orders          Ordered    predniSONE (DELTASONE) 50  MG tablet        08/19/23 1245              Fabienne Bruns 08/19/23 1247    Durwin Glaze, MD 08/20/23 [phone]

## 2023-08-19 NOTE — Discharge Instructions (Addendum)
You were evaluated in the emergency room for sore throat and flulike symptoms.  Your strep test was once again negative.  You were provided a short course of steroids for persistent cough and difficulty swallowing likely secondary to a viral pharyngitis.  If you experience any new or worsening symptoms including difficulty breathing, change in your voice, persistent fevers please return to the emergency room.  Otherwise I anticipate your symptoms should resolve over the next several days.

## 2023-08-19 NOTE — ED Notes (Signed)
Discharge instructions, follow up care, and prescription reviewed and explained, pt verbalized understanding and had no further questions on d/c.  

## 2023-08-19 NOTE — ED Triage Notes (Signed)
States had flu Sunday.  Now present with sore throat.  States has short of breath.  Feels throat is closing.  Good air entry  lungs clear.   Cough.

## 2023-08-20 DIAGNOSIS — Z419 Encounter for procedure for purposes other than remedying health state, unspecified: Secondary | ICD-10-CM | POA: Diagnosis not present

## 2023-09-17 DIAGNOSIS — Z419 Encounter for procedure for purposes other than remedying health state, unspecified: Secondary | ICD-10-CM | POA: Diagnosis not present

## 2023-10-29 DIAGNOSIS — Z419 Encounter for procedure for purposes other than remedying health state, unspecified: Secondary | ICD-10-CM | POA: Diagnosis not present

## 2023-11-28 DIAGNOSIS — Z419 Encounter for procedure for purposes other than remedying health state, unspecified: Secondary | ICD-10-CM | POA: Diagnosis not present

## 2023-12-29 DIAGNOSIS — Z419 Encounter for procedure for purposes other than remedying health state, unspecified: Secondary | ICD-10-CM | POA: Diagnosis not present

## 2024-02-08 ENCOUNTER — Ambulatory Visit
Admission: EM | Admit: 2024-02-08 | Discharge: 2024-02-08 | Disposition: A | Discharge status: Home / Self Care | Payer: Worker's Compensation

## 2024-02-08 DIAGNOSIS — Z87891 Personal history of nicotine dependence: Secondary | ICD-10-CM | POA: Insufficient documentation

## 2024-02-08 DIAGNOSIS — O99810 Abnormal glucose complicating pregnancy: Secondary | ICD-10-CM | POA: Insufficient documentation

## 2024-02-08 DIAGNOSIS — R52 Pain, unspecified: Secondary | ICD-10-CM | POA: Insufficient documentation

## 2024-02-08 DIAGNOSIS — M545 Low back pain, unspecified: Secondary | ICD-10-CM | POA: Diagnosis not present

## 2024-02-08 DIAGNOSIS — O26849 Uterine size-date discrepancy, unspecified trimester: Secondary | ICD-10-CM | POA: Insufficient documentation

## 2024-02-08 DIAGNOSIS — R252 Cramp and spasm: Secondary | ICD-10-CM | POA: Insufficient documentation

## 2024-02-08 DIAGNOSIS — R519 Headache, unspecified: Secondary | ICD-10-CM | POA: Insufficient documentation

## 2024-02-08 DIAGNOSIS — R319 Hematuria, unspecified: Secondary | ICD-10-CM | POA: Insufficient documentation

## 2024-02-08 MED ORDER — CYCLOBENZAPRINE HCL 10 MG PO TABS: 10.0000 mg | ORAL_TABLET | Freq: Two times a day (BID) | ORAL | 0 refills | Status: DC | PRN

## 2024-02-08 MED ORDER — PREDNISONE 20 MG PO TABS: 40.0000 mg | ORAL_TABLET | Freq: Every day | ORAL | 0 refills | Status: AC

## 2024-02-08 NOTE — ED Triage Notes (Signed)
 Starting yesterday after lifting cases of chicken my back started hurting, it seem's to be getting worse. Pain in in lower/mid back, no radiation of pain.

## 2024-02-09 NOTE — ED Provider Notes (Signed)
 [name]    CSN: [id number] Arrival date & time: 02/08/24  1608      History   Chief Complaint Chief Complaint  Patient presents with   Back Pain    HPI [name] is a 33 y.o. female.   Patient here today for evaluation of lower back pain that started after she was lifting cases of chicken yesterday.  She reports that pain seems to be worsening with time.  She notes that pain is present to her lower mid back.  She denies any radiation of pain.  She has not had any numbness or tingling.  She denies any loss of bowel or bladder function.  She has taken over-the-counter medication without resolution.  Movement worsens pain  The history is provided by the patient.  Back Pain Associated symptoms: no abdominal pain, no fever and no numbness     Past Medical History:  Diagnosis Date   Anemia    Obesity     Patient Active Problem List   Diagnosis Date Noted   Blood in urine 02/08/2024   Crampy pain 02/08/2024   Ex-smoker 02/08/2024   Headache 02/08/2024   Abnormal glucose tolerance test (GTT) during pregnancy, antepartum 02/08/2024   Low back pain 02/08/2024   Uterine size-date discrepancy 02/08/2024   Mild depression 02/18/2020   Anxiety 02/18/2020   ETD (Eustachian tube dysfunction), bilateral 10/25/2017   Non-recurrent acute serous otitis media of right ear 10/25/2017   Seasonal allergic rhinitis 10/25/2017   Vertigo 10/25/2017   Pregnancy-induced hypertension 05/30/2013   Vitamin D  deficiency 05/30/2013   Smoker 05/30/2013   Obesity 02/08/2013   Allergy to latex 02/08/2013   Urinary tract infectious disease 02/08/2013   Back pain complicating pregnancy 02/08/2013   Fundal height low for dates 11/06/2012   History of anemia 07/19/2010    Past Surgical History:  Procedure Laterality Date   CHOLECYSTECTOMY     ELBOW FRACTURE SURGERY     BROKEN HUMEROUS   WISDOM TOOTH EXTRACTION      OB History     Gravida  2   Para  1   Term   1   Preterm      AB  1   Living  1      SAB      IAB  1   Ectopic      Multiple      Live Births  1            Home Medications    Prior to Admission medications   Medication Sig Start Date End Date Taking? Authorizing Provider  acetaminophen  (TYLENOL ) 650 MG suppository Place 650 mg rectally every 4 (four) hours as needed.   Yes [provider]  acetaminophen -codeine (TYLENOL  #3) 300-30 MG tablet Take 1-2 tablets by mouth every 4 (four) hours as needed. 10/25/17  Yes [provider]  ascorbic acid (VITAMIN C) 500 MG tablet Take 500 mg by mouth daily.   Yes [provider]  cyclobenzaprine  (FLEXERIL ) 10 MG tablet Take 1 tablet (10 mg total) by mouth 2 (two) times daily as needed for muscle spasms. 02/08/24  Yes Billy Asberry FALCON, PA-C  ibuprofen  (ADVIL ) 600 MG tablet Take 600 mg by mouth every 6 (six) hours as needed. 10/25/17  Yes [provider]  levonorgestrel (MIRENA, 52 MG,) 20 MCG/DAY IUD 1 each by Intrauterine route once. 08/08/13  Yes [provider]  Magnesium  250 MG TABS Take 1 tablet by mouth with evening meal  02/18/20  Yes Moore, Janece, FNP  meclizine (ANTIVERT) 12.5 MG tablet Take 1 tablet by mouth 3 (three) times daily as needed. 10/19/17  Yes [provider]  Multiple Vitamins-Minerals (WOMENS MULTIVITAMIN PO) Take by mouth.   Yes [provider]  predniSONE  (DELTASONE ) 20 MG tablet Take 2 tablets (40 mg total) by mouth daily with breakfast for 5 days. 02/08/24 02/13/24 Yes Billy Asberry FALCON, PA-C  Prenat-FeFmCb-DSS-FA-DHA w/o A (CITRANATAL HARMONY) 27-1-260 MG CAPS Take 1 capsule every day by oral route. 10/25/12  Yes [provider]  Vitamin D , Ergocalciferol , (DRISDOL) 1.25 MG (50000 UNIT) CAPS capsule Take 50,000 Units by mouth every 7 (seven) days. 10/25/17  Yes [provider]  albuterol  (PROVENTIL  HFA;VENTOLIN  HFA) 108 (90 Base) MCG/ACT inhaler Inhale 1-2 puffs into the lungs every 6 (six)  hours as needed for wheezing or shortness of breath. 10/03/17   Christopher Savannah, PA-C  Calcium Carbonate Antacid (TUMS PO) Tums    [provider]  cetirizine  (ZYRTEC  ALLERGY) 10 MG tablet Take 1 tablet (10 mg total) by mouth daily. Patient not taking: Reported on 07/21/2022 10/03/17   Christopher Savannah, PA-C  Ergocalciferol  (VITAMIN D2 PO) Vitamin D2  take 1,000 mg po qd    [provider]  Ferrous Sulfate (IRON PO) 2 (two) times daily.    [provider]  fluticasone  (FLONASE ) 50 MCG/ACT nasal spray Place 2 sprays into both nostrils daily. 10/25/17   [provider]  raNITIdine HCl (WAL-ZAN 75 PO)     [provider]    Family History Family History  Problem Relation Age of Onset   Arthritis Mother    Hypertension Mother    Arthritis Father    Hypertension Father    Anemia Father    Heart disease Maternal Grandmother    Diabetes Maternal Grandmother    Heart disease Paternal Grandmother    Colon cancer Neg Hx    Stomach cancer Neg Hx    Esophageal cancer Neg Hx     Social History Social History   Tobacco Use   Smoking status: Former    Current packs/day: 0.10    Average packs/day: 0.1 packs/day for 2.0 years (0.2 ttl pk-yrs)    Types: Cigarettes   Smokeless tobacco: Never   Tobacco comments:    quit after found out preg  Vaping Use   Vaping status: Never Used  Substance Use Topics   Alcohol use: Not Currently    Comment: OCCASIONAL   Drug use: No     Allergies   Latex, Nickel, and Other   Review of Systems Review of Systems  Constitutional:  Negative for chills and fever.  Eyes:  Negative for discharge and redness.  Respiratory:  Negative for shortness of breath.   Gastrointestinal:  Negative for abdominal pain, nausea and vomiting.  Musculoskeletal:  Positive for back pain and myalgias.  Neurological:  Negative for numbness.     Physical Exam Triage Vital Signs ED Triage Vitals  Encounter Vitals Group     BP 02/08/24  1624 117/76     Girls Systolic BP Percentile --      Girls Diastolic BP Percentile --      Boys Systolic BP Percentile --      Boys Diastolic BP Percentile --      Pulse Rate 02/08/24 1624 90     Resp 02/08/24 1624 18     Temp 02/08/24 1624 98.2 F (36.8 C)     Temp Source 02/08/24 1624 Oral  SpO2 02/08/24 1624 99 %     Weight 02/08/24 1620 270 lb (122.5 kg)     Height 02/08/24 1620 5' 4 (1.626 m)     Head Circumference --      Peak Flow --      Pain Score 02/08/24 1617 5     Pain Loc --      Pain Education --      Exclude from Growth Chart --    No data found.  Updated Vital Signs BP 117/76 (BP Location: Left Arm)   Pulse 90   Temp 98.2 F (36.8 C) (Oral)   Resp 18   Ht 5' 4 (1.626 m)   Wt 270 lb (122.5 kg)   SpO2 99%   BMI 46.35 kg/m   Visual Acuity Right Eye Distance:   Left Eye Distance:   Bilateral Distance:    Right Eye Near:   Left Eye Near:    Bilateral Near:     Physical Exam Vitals and nursing note reviewed.  Constitutional:      General: She is not in acute distress.    Appearance: Normal appearance. She is not ill-appearing.  HENT:     Head: Normocephalic and atraumatic.  Eyes:     Conjunctiva/sclera: Conjunctivae normal.  Cardiovascular:     Rate and Rhythm: Normal rate.  Pulmonary:     Effort: Pulmonary effort is normal. No respiratory distress.  Musculoskeletal:     Comments: No tenderness to palpation to midline thoracic or lumbar spine.  Mild tenderness palpation noted across low back.  Neurological:     Mental Status: She is alert.  Psychiatric:        Mood and Affect: Mood normal.        Behavior: Behavior normal.        Thought Content: Thought content normal.      UC Treatments / Results  Labs (all labs ordered are listed, but only abnormal results are displayed) Labs Reviewed - No data to display  EKG   Radiology No results found.  Procedures Procedures (including critical care time)  Medications Ordered in  UC Medications - No data to display  Initial Impression / Assessment and Plan / UC Course  I have reviewed the triage vital signs and the nursing notes.  Pertinent labs & imaging results that were available during my care of the patient were reviewed by me and considered in my medical decision making (see chart for details).    Suspect muscular strain and will treat with muscle relaxer and steroid burst.  Advised follow-up if no gradual improvement with any further concerns.  Discussed that muscle relaxer may cause drowsiness and to use with caution.  Encouraged heat, massage and slow stretches.  Final Clinical Impressions(s) / UC Diagnoses   Final diagnoses:  Acute right-sided low back pain without sciatica   Discharge Instructions   None    ED Prescriptions     Medication Sig Dispense Auth. Provider   predniSONE  (DELTASONE ) 20 MG tablet Take 2 tablets (40 mg total) by mouth daily with breakfast for 5 days. 10 tablet Billy Stabs F, PA-C   cyclobenzaprine  (FLEXERIL ) 10 MG tablet Take 1 tablet (10 mg total) by mouth 2 (two) times daily as needed for muscle spasms. 20 tablet Billy Stabs FALCON, PA-C      PDMP not reviewed this encounter.   Billy Stabs FALCON, PA-C 02/09/24 [facility]

## 2024-05-04 ENCOUNTER — Encounter: Payer: Self-pay | Admitting: Family Medicine

## 2024-05-04 ENCOUNTER — Ambulatory Visit (INDEPENDENT_AMBULATORY_CARE_PROVIDER_SITE_OTHER): Admitting: Family Medicine

## 2024-05-04 ENCOUNTER — Other Ambulatory Visit: Payer: Self-pay | Admitting: Family Medicine

## 2024-05-04 ENCOUNTER — Ambulatory Visit: Payer: Self-pay | Admitting: Family Medicine

## 2024-05-04 DIAGNOSIS — R102 Pelvic and perineal pain unspecified side: Secondary | ICD-10-CM

## 2024-05-04 DIAGNOSIS — Z862 Personal history of diseases of the blood and blood-forming organs and certain disorders involving the immune mechanism: Secondary | ICD-10-CM

## 2024-05-04 DIAGNOSIS — Z6841 Body Mass Index (BMI) 40.0 and over, adult: Secondary | ICD-10-CM | POA: Diagnosis not present

## 2024-05-04 DIAGNOSIS — E559 Vitamin D deficiency, unspecified: Secondary | ICD-10-CM | POA: Diagnosis not present

## 2024-05-04 DIAGNOSIS — Z975 Presence of (intrauterine) contraceptive device: Secondary | ICD-10-CM

## 2024-05-04 DIAGNOSIS — Z124 Encounter for screening for malignant neoplasm of cervix: Secondary | ICD-10-CM

## 2024-05-04 DIAGNOSIS — Z23 Encounter for immunization: Secondary | ICD-10-CM

## 2024-05-04 DIAGNOSIS — Z113 Encounter for screening for infections with a predominantly sexual mode of transmission: Secondary | ICD-10-CM

## 2024-05-04 LAB — URINALYSIS, ROUTINE W REFLEX MICROSCOPIC
Bilirubin Urine: NEGATIVE
Hgb urine dipstick: NEGATIVE
Ketones, ur: NEGATIVE
Nitrite: NEGATIVE
Specific Gravity, Urine: 1.02 (ref 1.000–1.030)
Total Protein, Urine: NEGATIVE
Urine Glucose: NEGATIVE
Urobilinogen, UA: 0.2 (ref 0.0–1.0)
pH: 6.5 (ref 5.0–8.0)

## 2024-05-04 LAB — COMPREHENSIVE METABOLIC PANEL WITH GFR
ALT: 19 U/L (ref 0–35)
AST: 16 U/L (ref 0–37)
Albumin: 4.1 g/dL (ref 3.5–5.2)
Alkaline Phosphatase: 70 U/L (ref 39–117)
BUN: 13 mg/dL (ref 6–23)
CO2: 26 meq/L (ref 19–32)
Calcium: 8.9 mg/dL (ref 8.4–10.5)
Chloride: 105 meq/L (ref 96–112)
Creatinine, Ser: 0.65 mg/dL (ref 0.40–1.20)
GFR: 116.54 mL/min (ref >60.00)
Glucose, Bld: 94 mg/dL (ref 70–99)
Potassium: 4.1 meq/L (ref 3.5–5.1)
Sodium: 139 meq/L (ref 135–145)
Total Bilirubin: 0.3 mg/dL (ref 0.2–1.2)
Total Protein: 6.8 g/dL (ref 6.0–8.3)

## 2024-05-04 LAB — T4, FREE: Free T4: 0.8 ng/dL (ref 0.60–1.60)

## 2024-05-04 LAB — CBC WITH DIFFERENTIAL/PLATELET
Basophils Absolute: 0 K/uL (ref 0.0–0.1)
Basophils Relative: 0.8 % (ref 0.0–3.0)
Eosinophils Absolute: 0.1 K/uL (ref 0.0–0.7)
Eosinophils Relative: 1.3 % (ref 0.0–5.0)
HCT: 40.2 % (ref 36.0–46.0)
Hemoglobin: 12.9 g/dL (ref 12.0–15.0)
Lymphocytes Relative: 27.8 % (ref 12.0–46.0)
Lymphs Abs: 1.5 K/uL (ref 0.7–4.0)
MCHC: 32.2 g/dL (ref 30.0–36.0)
MCV: 79.3 fl (ref 78.0–100.0)
Monocytes Absolute: 0.4 K/uL (ref 0.1–1.0)
Monocytes Relative: 7.6 % (ref 3.0–12.0)
Neutro Abs: 3.3 K/uL (ref 1.4–7.7)
Neutrophils Relative %: 62.5 % (ref 43.0–77.0)
Platelets: 324 K/uL (ref 150.0–400.0)
RBC: 5.07 Mil/uL (ref 3.87–5.11)
RDW: 14.8 % (ref 11.5–15.5)
WBC: 5.3 K/uL (ref 4.0–10.5)

## 2024-05-04 LAB — LIPID PANEL
Cholesterol: 148 mg/dL (ref 0–200)
HDL: 57 mg/dL (ref >39.00)
LDL Cholesterol: 78 mg/dL (ref 0–99)
NonHDL: 90.56
Total CHOL/HDL Ratio: 3
Triglycerides: 63 mg/dL (ref 0.0–149.0)
VLDL: 12.6 mg/dL (ref 0.0–40.0)

## 2024-05-04 LAB — VITAMIN D 25 HYDROXY (VIT D DEFICIENCY, FRACTURES): VITD: 11.12 ng/mL — ABNORMAL LOW (ref 30.00–100.00)

## 2024-05-04 LAB — FERRITIN: Ferritin: 14.5 ng/mL (ref 10.0–291.0)

## 2024-05-04 LAB — HEMOGLOBIN A1C: Hgb A1c MFr Bld: 5.6 % (ref 4.6–6.5)

## 2024-05-04 LAB — FOLATE: Folate: 7.7 ng/mL (ref >5.9)

## 2024-05-04 LAB — VITAMIN B12: Vitamin B-12: 512 pg/mL (ref 211–911)

## 2024-05-04 LAB — TSH: TSH: 0.75 u[IU]/mL (ref 0.35–5.50)

## 2024-05-04 MED ORDER — VITAMIN D (ERGOCALCIFEROL) 1.25 MG (50000 UNIT) PO CAPS: 50000.0000 [IU] | ORAL_CAPSULE | ORAL | 1 refills | Status: AC

## 2024-05-04 NOTE — Patient Instructions (Signed)
 Thank you for trusting us  with your health care.  Please go downstairs for labs and a urine test before you leave  I will be in touch with your results and with recommendations  I referred you to [facility].  If you have not heard from them by next week, you can call to schedule your appointment

## 2024-05-04 NOTE — Progress Notes (Signed)
 New Patient Office Visit  Subjective    Patient ID: [name], female    DOB: 1991/05/27  Age: 33 y.o. MRN: [id number]  CC:  Chief Complaint  Patient presents with   Establish Care    No concerns, hasn't had pcp in a while  Obgyn referral     Discussed the use of AI scribe software for clinical note transcription with the patient, who gave verbal consent to proceed.  History of Present Illness [name] is a 33 year old female who presents to establish care.  Elevated blood pressure - Recently observed elevated blood pressure readings - No prior diagnosis of hypertension - No antihypertensive medication use - No associated symptoms such as chest pain, shortness of breath, or palpitations  Prediabetes - Diagnosed with prediabetes in 2021 - Blood glucose levels have normalized following lifestyle modifications - No recent follow-up or check-up since normalization  Tobacco use - Quit smoking three to four months ago - No current exposure to tobacco smoke  Gynecologic symptoms - Uses Mirena IUD, currently on second device for a total of ten years - Experiences occasional pelvic pain in waves - No dyspareunia, vaginal discharge, or abnormal odor - Request referral to OB/GYN, [place] where she used to go.  Anemia and vitamin d  deficiency - History of anemia - Vitamin D  deficiency managed with supplementation  Mood and energy - Initial depression screening positive, but attributes symptoms to a busy lifestyle - Denies feeling depressed, hopeless, or down - Maintains good energy levels despite a busy schedule  Constitutional and gastrointestinal symptoms - No recent fever, chills, nausea, vomiting, diarrhea, or constipation     Outpatient Encounter Medications as of 05/04/2024  Medication Sig   acetaminophen  (TYLENOL ) 650 MG suppository Place 650 mg rectally every 4 (four) hours as needed.   albuterol  (PROVENTIL  HFA;VENTOLIN  HFA) 108 (90  Base) MCG/ACT inhaler Inhale 1-2 puffs into the lungs every 6 (six) hours as needed for wheezing or shortness of breath.   ascorbic acid (VITAMIN C) 500 MG tablet Take 500 mg by mouth daily.   Calcium Carbonate Antacid (TUMS PO) Tums   cetirizine  (ZYRTEC  ALLERGY) 10 MG tablet Take 1 tablet (10 mg total) by mouth daily.   Ergocalciferol  (VITAMIN D2 PO) Vitamin D2  take 1,000 mg po qd   Ferrous Sulfate (IRON PO) 2 (two) times daily.   fluticasone  (FLONASE ) 50 MCG/ACT nasal spray Place 2 sprays into both nostrils daily.   levonorgestrel (MIRENA, 52 MG,) 20 MCG/DAY IUD 1 each by Intrauterine route once.   Magnesium  250 MG TABS Take 1 tablet by mouth with evening meal   meclizine (ANTIVERT) 12.5 MG tablet Take 1 tablet by mouth 3 (three) times daily as needed.   Multiple Vitamins-Minerals (WOMENS MULTIVITAMIN PO) Take by mouth.   [DISCONTINUED] acetaminophen -codeine (TYLENOL  #3) 300-30 MG tablet Take 1-2 tablets by mouth every 4 (four) hours as needed.   [DISCONTINUED] cyclobenzaprine  (FLEXERIL ) 10 MG tablet Take 1 tablet (10 mg total) by mouth 2 (two) times daily as needed for muscle spasms.   [DISCONTINUED] ibuprofen  (ADVIL ) 600 MG tablet Take 600 mg by mouth every 6 (six) hours as needed.   [DISCONTINUED] Prenat-FeFmCb-DSS-FA-DHA w/o A (CITRANATAL HARMONY) 27-1-260 MG CAPS Take 1 capsule every day by oral route.   [DISCONTINUED] raNITIdine HCl (WAL-ZAN 75 PO)    [DISCONTINUED] Vitamin D , Ergocalciferol , (DRISDOL) 1.25 MG (50000 UNIT) CAPS capsule Take 50,000 Units by mouth every 7 (seven) days.   No facility-administered encounter medications on  file as of 05/04/2024.    Past Medical History:  Diagnosis Date   Anemia    Obesity     Past Surgical History:  Procedure Laterality Date   CHOLECYSTECTOMY     ELBOW FRACTURE SURGERY     BROKEN HUMEROUS   WISDOM TOOTH EXTRACTION      Family History  Problem Relation Age of Onset   Arthritis Mother    Hypertension Mother    Arthritis  Father    Hypertension Father    Anemia Father    Heart disease Maternal Grandmother    Diabetes Maternal Grandmother    Heart disease Paternal Grandmother    Colon cancer Neg Hx    Stomach cancer Neg Hx    Esophageal cancer Neg Hx     Social History   Socioeconomic History   Marital status: Single    Spouse name: Not on file   Number of children: 1   Years of education: Not on file   Highest education level: Not on file  Occupational History   Occupation: chief  Tobacco Use   Smoking status: Former    Current packs/day: 0.10    Average packs/day: 0.1 packs/day for 2.0 years (0.2 ttl pk-yrs)    Types: Cigarettes   Smokeless tobacco: Never   Tobacco comments:    quit after found out preg  Vaping Use   Vaping status: Never Used  Substance and Sexual Activity   Alcohol use: Not Currently    Comment: OCCASIONAL   Drug use: No   Sexual activity: Yes    Birth control/protection: I.U.D.  Other Topics Concern   Not on file  Social History Narrative   Not on file   Social Drivers of Health   Financial Resource Strain: Not on file  Food Insecurity: Not on file  Transportation Needs: Not on file  Physical Activity: Not on file  Stress: Not on file  Social Connections: Not on file  Intimate Partner Violence: Not on file    ROS Per HPI      Objective    BP 128/86   Pulse 72   Temp 97.9 F (36.6 C) (Temporal)   Ht 5' 4 (1.626 m)   Wt 270 lb (122.5 kg)   SpO2 98%   BMI 46.35 kg/m   Physical Exam Constitutional:      General: She is not in acute distress.    Appearance: She is obese. She is not ill-appearing.  HENT:     Mouth/Throat:     Mouth: Mucous membranes are moist.     Pharynx: Oropharynx is clear.  Eyes:     Extraocular Movements: Extraocular movements intact.     Conjunctiva/sclera: Conjunctivae normal.     Pupils: Pupils are equal, round, and reactive to light.  Cardiovascular:     Rate and Rhythm: Normal rate and regular rhythm.   Pulmonary:     Effort: Pulmonary effort is normal.     Breath sounds: Normal breath sounds.  Musculoskeletal:     Cervical back: Normal range of motion and neck supple. No tenderness.     Right lower leg: No edema.     Left lower leg: No edema.  Lymphadenopathy:     Cervical: No cervical adenopathy.  Skin:    General: Skin is warm and dry.  Neurological:     General: No focal deficit present.     Mental Status: She is alert and oriented to person, place, and time.     Motor: No  weakness.     Coordination: Coordination normal.     Gait: Gait normal.  Psychiatric:        Mood and Affect: Mood normal.        Behavior: Behavior normal.        Thought Content: Thought content normal.         Assessment & Plan:   Problem List Items Addressed This Visit     History of anemia   Relevant Orders   CBC with Differential/Platelet (Completed)   Comprehensive metabolic panel with GFR (Completed)   Ferritin (Completed)   Folate (Completed)   Vitamin B12 (Completed)   Vitamin D  deficiency   Relevant Orders   VITAMIN D  25 Hydroxy (Vit-D Deficiency, Fractures) (Completed)   Other Visit Diagnoses       Morbid obesity (HCC)    -  Primary   Relevant Orders   CBC with Differential/Platelet (Completed)   Comprehensive metabolic panel with GFR (Completed)   Hemoglobin A1c (Completed)   Lipid panel (Completed)   TSH (Completed)   T4, free (Completed)   Urinalysis, Routine w reflex microscopic (Completed)     Need for influenza vaccination       Relevant Orders   Flu vaccine trivalent PF, 6mos and older(Flulaval,Afluria,Fluarix,Fluzone) (Completed)     Screening for cervical cancer       Relevant Orders   Ambulatory referral to Obstetrics / Gynecology     IUD (intrauterine device) in place       Relevant Orders   Ambulatory referral to Obstetrics / Gynecology     Pelvic pain       Relevant Orders   GC/Chlamydia Probe Amp   Urinalysis, Routine w reflex microscopic (Completed)    Ambulatory referral to Obstetrics / Gynecology     Screen for STD (sexually transmitted disease)       Relevant Orders   Hepatitis C antibody   RPR   GC/Chlamydia Probe Amp   HIV Antibody (routine testing w rflx)       Assessment and Plan Assessment & Plan Morbid obesity BMI is significantly elevated. She has been trying to manage weight for years without success. - Order blood work to check cholesterol, A1c, kidney, liver, and thyroid function - Screen for complications related to morbid obesity  Pelvic pain Intermittent pelvic pain, possibly related to IUD. No dyspareunia or vaginal discharge reported. - Refer to OBGYN for evaluation of pelvic pain and IUD check  History of prediabetes Prediabetes noted previously with normal results in 2021. No recent follow-up on blood glucose levels. - Order A1c test to assess current glucose control  History of anemia and vitamin D  deficiency Low iron and vitamin D  deficiency previously noted. Currently taking vitamin D  supplements. - Order blood work to assess current iron and vitamin D  levels  Tobacco use, recent quitter Quit smoking 3-4 months ago. Acknowledges health risks associated with smoking.  General Health Maintenance Flu shot received. No recent Pap smear. No current depression despite positive screening. - Refer to OBGYN for Pap smear     Return for pending labs.   Boby Mackintosh, NP-C

## 2024-05-07 LAB — HIV ANTIBODY (ROUTINE TESTING W REFLEX)
HIV 1&2 Ab, 4th Generation: NONREACTIVE
HIV FINAL INTERPRETATION: NEGATIVE

## 2024-05-07 LAB — HEPATITIS C ANTIBODY: Hepatitis C Ab: NONREACTIVE

## 2024-05-07 LAB — GC/CHLAMYDIA PROBE AMP
Chlamydia trachomatis, NAA: NEGATIVE
Neisseria Gonorrhoeae by PCR: NEGATIVE

## 2024-05-07 LAB — RPR: RPR Ser Ql: NONREACTIVE

## 2024-06-07 ENCOUNTER — Ambulatory Visit: Payer: Self-pay

## 2024-06-07 ENCOUNTER — Encounter: Payer: Self-pay | Admitting: Internal Medicine

## 2024-06-07 ENCOUNTER — Ambulatory Visit: Admitting: Internal Medicine

## 2024-06-07 VITALS — BP 142/70 | HR 93 | Temp 98.3°F | Ht 64.0 in

## 2024-06-07 DIAGNOSIS — J209 Acute bronchitis, unspecified: Secondary | ICD-10-CM | POA: Insufficient documentation

## 2024-06-07 MED ORDER — AZITHROMYCIN 250 MG PO TABS: ORAL_TABLET | ORAL | 0 refills | Status: AC

## 2024-06-07 MED ORDER — PROMETHAZINE-DM 6.25-15 MG/5ML PO SYRP: 5.0000 mL | ORAL_SOLUTION | Freq: Four times a day (QID) | ORAL | 0 refills | Status: AC | PRN

## 2024-06-07 NOTE — Assessment & Plan Note (Addendum)
 Z pack Prom DM syrup prn Call if not well soon Off work x 2 d

## 2024-06-07 NOTE — Telephone Encounter (Signed)
 FYI Only or Action Required?: FYI only for provider: appointment scheduled on 06/07/2024.  Patient was last seen in primary care on 05/04/2024 by Lendia Boby CROME, NP-C.  Called Nurse Triage reporting Facial Pain.  Symptoms began several days ago.  Interventions attempted: OTC medications: Dayquil, Nyquil, Emergen-C and Rest, hydration, or home remedies.  Symptoms are: gradually worsening.  Triage Disposition: See PCP When Office is Open (Within 3 Days)  Patient/caregiver understands and will follow disposition?: Yes        Copied from CRM #[id number]. Topic: Clinical - Red Word Triage >> Jun 07, 2024  8:13 AM Avram MATSU wrote: Red Word that prompted transfer to Nurse Triage: sinus pressure, low grade fever, yellow drainage, very congested. Reason for Disposition  [1] Sinus congestion (pressure, fullness) AND [2] present > 10 days    Symptoms started on Monday  Answer Assessment - Initial Assessment Questions 1. LOCATION: Where does it hurt?      Sinus area has some pressure 2. ONSET: When did the sinus pain start?  (e.g., hours, days)      Monday  3. SEVERITY: How bad is the pain?   (Scale 0-10; or none, mild, moderate or severe)     Mild  4. RECURRENT SYMPTOM: Have you ever had sinus problems before? If Yes, ask: When was the last time? and What happened that time?      Yes  5. NASAL CONGESTION: Is the nose blocked? If Yes, ask: Can you open it or must you breathe through your mouth?     One side is blocked  6. NASAL DISCHARGE: Do you have discharge from your nose? If so ask, What color?     Yes, drainage is  yellow in color  7. FEVER: Do you have a fever? If Yes, ask: What is it, how was it measured, and when did it start?      Low grade fever  8. OTHER SYMPTOMS: Do you have any other symptoms? (e.g., sore throat, cough, earache, difficulty breathing)     Sore throat  Pt states she took a Covid test and it was  negative. Currently using Dayquil,  nyquil, tea, honey and emergen-C for symptoms.  Protocols used: Sinus Pain or Congestion-A-AH

## 2024-06-07 NOTE — Progress Notes (Signed)
 Subjective:  Patient ID: [name], female    DOB: 1991/03/30  Age: 33 y.o. MRN: [id number]  CC: Sinus Problem (Sinus pressure, low grade fever, yellow sputum, congestion for 5 days. Patient covid- as recently as Tuesday)   HPI [name] presents for URI x 2-3 d Son is sick C/o cough - green mucus COVID (-) 2 d ago  Outpatient Medications Prior to Visit  Medication Sig Dispense Refill   acetaminophen  (TYLENOL ) 650 MG suppository Place 650 mg rectally every 4 (four) hours as needed.     albuterol  (PROVENTIL  HFA;VENTOLIN  HFA) 108 (90 Base) MCG/ACT inhaler Inhale 1-2 puffs into the lungs every 6 (six) hours as needed for wheezing or shortness of breath. 1 Inhaler 0   ascorbic acid (VITAMIN C) 500 MG tablet Take 500 mg by mouth daily.     Calcium Carbonate Antacid (TUMS PO) Tums     cetirizine  (ZYRTEC  ALLERGY) 10 MG tablet Take 1 tablet (10 mg total) by mouth daily. 90 tablet 1   Ferrous Sulfate (IRON PO) 2 (two) times daily.     fluticasone  (FLONASE ) 50 MCG/ACT nasal spray Place 2 sprays into both nostrils daily.     levonorgestrel (MIRENA, 52 MG,) 20 MCG/DAY IUD 1 each by Intrauterine route once.     Magnesium  250 MG TABS Take 1 tablet by mouth with evening meal 30 tablet 1   meclizine (ANTIVERT) 12.5 MG tablet Take 1 tablet by mouth 3 (three) times daily as needed.     Multiple Vitamins-Minerals (WOMENS MULTIVITAMIN PO) Take by mouth.     Vitamin D , Ergocalciferol , (DRISDOL ) 1.25 MG (50000 UNIT) CAPS capsule Take 1 capsule (50,000 Units total) by mouth every 7 (seven) days. 6 capsule 1   No facility-administered medications prior to visit.    ROS: Review of Systems  Constitutional:  Positive for fatigue. Negative for fever.  HENT:  Positive for congestion and sinus pain.   Respiratory:  Positive for cough.     Objective:  BP (!) 142/70   Pulse 93   Temp 98.3 F (36.8 C)   Ht 5' 4 (1.626 m)   SpO2 97%   BMI 46.35 kg/m   BP Readings from Last 3  Encounters:  06/07/24 (!) 142/70  05/04/24 128/86  02/08/24 117/76    Wt Readings from Last 3 Encounters:  05/04/24 270 lb (122.5 kg)  02/08/24 270 lb (122.5 kg)  08/19/23 260 lb (117.9 kg)    Physical Exam Constitutional:      Appearance: She is obese. She is not toxic-appearing.  HENT:     Nose: Congestion present.     Mouth/Throat:     Pharynx: Posterior oropharyngeal erythema present.  Eyes:     General:        Right eye: No discharge.        Left eye: No discharge.  Abdominal:     Tenderness: There is no abdominal tenderness.  Musculoskeletal:     Right lower leg: No edema.     Left lower leg: No edema.  Neurological:     Mental Status: She is oriented to person, place, and time.     Lab Results  Component Value Date   WBC 5.3 05/04/2024   HGB 12.9 05/04/2024   HCT 40.2 05/04/2024   PLT 324.0 05/04/2024   GLUCOSE 94 05/04/2024   CHOL 148 05/04/2024   TRIG 63.0 05/04/2024   HDL 57.00 05/04/2024   LDLCALC 78 05/04/2024   ALT 19 05/04/2024  AST 16 05/04/2024   NA 139 05/04/2024   K 4.1 05/04/2024   CL 105 05/04/2024   CREATININE 0.65 05/04/2024   BUN 13 05/04/2024   CO2 26 05/04/2024   TSH 0.75 05/04/2024   HGBA1C 5.6 05/04/2024    No results found.  Assessment & Plan:   Problem List Items Addressed This Visit     Acute bronchitis - Primary   Z pack Prom DM syrup prn Call if not well soon Off work x 2 d          Meds ordered this encounter  Medications   promethazine-dextromethorphan (PROMETHAZINE-DM) 6.25-15 MG/5ML syrup    Sig: Take 5 mLs by mouth 4 (four) times daily as needed for cough.    Dispense:  240 mL    Refill:  0   azithromycin  (ZITHROMAX  Z-PAK) 250 MG tablet    Sig: As directed    Dispense:  6 tablet    Refill:  0      Follow-up: Return for f/u with PCP.  Marolyn Noel, MD

## 2024-06-08 ENCOUNTER — Encounter: Payer: Self-pay | Admitting: Internal Medicine
